# Patient Record
Sex: Female | Born: 1955 | Race: Black or African American | Hispanic: No | Smoking: Never smoker
Health system: Southern US, Community
[De-identification: ages and names within clinical notes are randomized; demographics above are authoritative.]

## PROBLEM LIST (undated history)

## (undated) DIAGNOSIS — R079 Chest pain, unspecified: Secondary | ICD-10-CM

## (undated) DIAGNOSIS — R011 Cardiac murmur, unspecified: Secondary | ICD-10-CM

## (undated) DIAGNOSIS — C50919 Malignant neoplasm of unspecified site of unspecified female breast: Secondary | ICD-10-CM

## (undated) DIAGNOSIS — H269 Unspecified cataract: Secondary | ICD-10-CM

## (undated) DIAGNOSIS — R7303 Prediabetes: Secondary | ICD-10-CM

## (undated) DIAGNOSIS — E785 Hyperlipidemia, unspecified: Secondary | ICD-10-CM

## (undated) DIAGNOSIS — I2699 Other pulmonary embolism without acute cor pulmonale: Secondary | ICD-10-CM

## (undated) DIAGNOSIS — J45909 Unspecified asthma, uncomplicated: Secondary | ICD-10-CM

## (undated) DIAGNOSIS — Z90710 Acquired absence of both cervix and uterus: Secondary | ICD-10-CM

## (undated) DIAGNOSIS — K219 Gastro-esophageal reflux disease without esophagitis: Secondary | ICD-10-CM

## (undated) DIAGNOSIS — E079 Disorder of thyroid, unspecified: Secondary | ICD-10-CM

## (undated) DIAGNOSIS — I10 Essential (primary) hypertension: Secondary | ICD-10-CM

## (undated) DIAGNOSIS — D649 Anemia, unspecified: Secondary | ICD-10-CM

## (undated) DIAGNOSIS — T7840XA Allergy, unspecified, initial encounter: Secondary | ICD-10-CM

## (undated) HISTORY — DX: Unspecified cataract: H26.9

## (undated) HISTORY — DX: Anemia, unspecified: D64.9

## (undated) HISTORY — DX: Chest pain, unspecified: R07.9

## (undated) HISTORY — PX: ABDOMINAL HYSTERECTOMY: SHX81

## (undated) HISTORY — DX: Unspecified asthma, uncomplicated: J45.909

## (undated) HISTORY — DX: Other pulmonary embolism without acute cor pulmonale: I26.99

## (undated) HISTORY — DX: Allergy, unspecified, initial encounter: T78.40XA

## (undated) HISTORY — DX: Malignant neoplasm of unspecified site of unspecified female breast: C50.919

## (undated) HISTORY — DX: Cardiac murmur, unspecified: R01.1

## (undated) HISTORY — DX: Hyperlipidemia, unspecified: E78.5

## (undated) HISTORY — DX: Gastro-esophageal reflux disease without esophagitis: K21.9

## (undated) HISTORY — DX: Acquired absence of both cervix and uterus: Z90.710

---

## 1997-08-06 ENCOUNTER — Encounter: Admission: RE | Admit: 1997-08-06 | Discharge: 1997-08-06 | Payer: Self-pay | Admitting: *Deleted

## 1998-03-19 ENCOUNTER — Encounter: Payer: Self-pay | Admitting: Family Medicine

## 1998-03-19 ENCOUNTER — Ambulatory Visit (HOSPITAL_COMMUNITY): Admission: RE | Admit: 1998-03-19 | Discharge: 1998-03-19 | Payer: Self-pay | Admitting: Family Medicine

## 1998-04-07 ENCOUNTER — Ambulatory Visit (HOSPITAL_COMMUNITY): Admission: RE | Admit: 1998-04-07 | Discharge: 1998-04-07 | Payer: Self-pay | Admitting: Family Medicine

## 1998-04-07 ENCOUNTER — Encounter: Payer: Self-pay | Admitting: Family Medicine

## 1998-08-06 ENCOUNTER — Other Ambulatory Visit: Admission: RE | Admit: 1998-08-06 | Discharge: 1998-08-06 | Payer: Self-pay | Admitting: *Deleted

## 1998-08-20 ENCOUNTER — Encounter: Payer: Self-pay | Admitting: *Deleted

## 1998-08-20 ENCOUNTER — Ambulatory Visit (HOSPITAL_COMMUNITY): Admission: RE | Admit: 1998-08-20 | Discharge: 1998-08-20 | Payer: Self-pay | Admitting: *Deleted

## 1999-08-21 ENCOUNTER — Ambulatory Visit (HOSPITAL_COMMUNITY): Admission: RE | Admit: 1999-08-21 | Discharge: 1999-08-21 | Payer: Self-pay | Admitting: Obstetrics & Gynecology

## 1999-08-21 ENCOUNTER — Encounter: Payer: Self-pay | Admitting: Obstetrics & Gynecology

## 2000-10-20 ENCOUNTER — Other Ambulatory Visit: Admission: RE | Admit: 2000-10-20 | Discharge: 2000-10-20 | Payer: Self-pay | Admitting: Obstetrics and Gynecology

## 2000-11-08 ENCOUNTER — Ambulatory Visit (HOSPITAL_COMMUNITY): Admission: RE | Admit: 2000-11-08 | Discharge: 2000-11-08 | Payer: Self-pay | Admitting: Obstetrics and Gynecology

## 2000-11-08 ENCOUNTER — Encounter: Payer: Self-pay | Admitting: Obstetrics and Gynecology

## 2001-01-02 ENCOUNTER — Ambulatory Visit (HOSPITAL_COMMUNITY): Admission: RE | Admit: 2001-01-02 | Discharge: 2001-01-02 | Payer: Self-pay | Admitting: Obstetrics and Gynecology

## 2001-01-02 ENCOUNTER — Encounter: Payer: Self-pay | Admitting: Obstetrics and Gynecology

## 2001-01-26 ENCOUNTER — Encounter: Payer: Self-pay | Admitting: Obstetrics and Gynecology

## 2001-01-26 ENCOUNTER — Ambulatory Visit (HOSPITAL_COMMUNITY): Admission: RE | Admit: 2001-01-26 | Discharge: 2001-01-26 | Payer: Self-pay | Admitting: Obstetrics and Gynecology

## 2001-03-10 ENCOUNTER — Encounter: Admission: RE | Admit: 2001-03-10 | Discharge: 2001-03-10 | Payer: Self-pay | Admitting: Gynecology

## 2001-03-10 ENCOUNTER — Encounter: Payer: Self-pay | Admitting: Gynecology

## 2001-11-08 ENCOUNTER — Ambulatory Visit (HOSPITAL_COMMUNITY): Admission: RE | Admit: 2001-11-08 | Discharge: 2001-11-08 | Payer: Self-pay | Admitting: Gynecology

## 2001-11-08 ENCOUNTER — Encounter: Payer: Self-pay | Admitting: Gynecology

## 2001-11-21 ENCOUNTER — Encounter: Payer: Self-pay | Admitting: Gynecology

## 2001-11-21 ENCOUNTER — Ambulatory Visit (HOSPITAL_COMMUNITY): Admission: RE | Admit: 2001-11-21 | Discharge: 2001-11-21 | Payer: Self-pay | Admitting: Gynecology

## 2001-12-18 ENCOUNTER — Encounter: Payer: Self-pay | Admitting: Gynecology

## 2001-12-18 ENCOUNTER — Ambulatory Visit (HOSPITAL_COMMUNITY): Admission: RE | Admit: 2001-12-18 | Discharge: 2001-12-18 | Payer: Self-pay | Admitting: Gynecology

## 2002-01-09 ENCOUNTER — Ambulatory Visit (HOSPITAL_COMMUNITY): Admission: RE | Admit: 2002-01-09 | Discharge: 2002-01-09 | Payer: Self-pay | Admitting: Gynecology

## 2002-01-09 ENCOUNTER — Encounter: Payer: Self-pay | Admitting: Gynecology

## 2004-01-09 ENCOUNTER — Ambulatory Visit (HOSPITAL_COMMUNITY): Admission: RE | Admit: 2004-01-09 | Discharge: 2004-01-09 | Payer: Self-pay | Admitting: Internal Medicine

## 2006-08-24 ENCOUNTER — Encounter: Admission: RE | Admit: 2006-08-24 | Discharge: 2006-08-24 | Payer: Self-pay | Admitting: Internal Medicine

## 2006-12-05 ENCOUNTER — Ambulatory Visit (HOSPITAL_COMMUNITY): Admission: RE | Admit: 2006-12-05 | Discharge: 2006-12-05 | Payer: Self-pay | Admitting: Family Medicine

## 2007-04-30 ENCOUNTER — Observation Stay (HOSPITAL_COMMUNITY): Admission: EM | Admit: 2007-04-30 | Discharge: 2007-05-01 | Payer: Self-pay | Admitting: Emergency Medicine

## 2007-05-01 ENCOUNTER — Encounter (INDEPENDENT_AMBULATORY_CARE_PROVIDER_SITE_OTHER): Payer: Self-pay | Admitting: Internal Medicine

## 2010-02-11 ENCOUNTER — Encounter
Admission: RE | Admit: 2010-02-11 | Discharge: 2010-02-11 | Payer: Self-pay | Source: Home / Self Care | Attending: Internal Medicine | Admitting: Internal Medicine

## 2010-02-28 ENCOUNTER — Encounter: Payer: Self-pay | Admitting: Internal Medicine

## 2010-06-23 NOTE — H&P (Signed)
NAMESTACEYANN, Hester               ACCOUNT NO.:  [id number]   MEDICAL RECORD NO.:  [id number]          PATIENT TYPE:  OBV   LOCATION:  0106                         FACILITY:  [facility]   PHYSICIAN:  Ladell Pier, M.D.   DATE OF BIRTH:  02-15-1955   DATE OF ADMISSION:  04/30/2007  DATE OF DISCHARGE:                              HISTORY & PHYSICAL   CHIEF COMPLAINT:  Chest pain.   HISTORY OF PRESENT ILLNESS:  The patient is a 55 year old African  American female that presents today with chest pain.  She states that it  hurts in the center of her chest radiating to her back.  She only gets  the pain when she walks or when she is driving and she goes over a bump.  She does not get the pain at rest.  The pain normally goes away when she  stops whatever activity she is doing.  She has no shortness of breath,  no diaphoresis, no nausea or vomiting, no pain when she presses on the  area.  She had this about 3 years ago but at the time she was coughing  and she was told it was probably bronchitis with her history asthma.   PAST MEDICAL HISTORY:  Significant for hypothyroidism and asthma.   FAMILY HISTORY:  Mother is 38 with diabetes, hypertension.  Father is 64  with hypertension.   SOCIAL HISTORY:  She does not smoke or drink alcohol.  She is married.  She has no children.  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   MEDICATION:  Synthroid 50 mcg daily.   ALLERGIES:  None.   REVIEW OF SYSTEMS:  Negative or otherwise stated in the HPI.   PHYSICAL EXAMINATION:  Temperature 98.4, blood pressure 121/82, pulse of  89, respirations 18, pulse oximetry 99% on room air.  HEENT:  Normocephalic, atraumatic.  Pupils reactive to light.  Throat  without erythema.  CARDIOVASCULAR:  Regular rate and rhythm.  LUNGS:  Clear bilaterally with a 2/6 systolic murmur.  ABDOMEN:  Positive bowel sounds.  EXTREMITIES:  Without edema.   LABORATORIES:  EKG shows T-wave  inversion in the lateral leads.  WBC  4.7, hemoglobin 11.3, MCV 77.5, platelet of 300.  Myoglobin 41, MB less  than 1.0, troponin less than 0.05.  Sodium 140, potassium 4.6, chloride  108, CO2 30, glucose 80, BUN 6, creatinine 0.84, calcium 8.5.   ASSESSMENT AND PLAN:  1. Chest pain.  2. Hypothyroidism.  3. Mild anemia.  4. Heart murmur.  5. T-wave inversion on EKG.   Will admit the patient to the hospital, rule out with serial cardiac  enzymes.  Will check fasting lipid panel, TSH and D-dimer.  If all is  negative, the patient could be discharged home to follow up with PCP for  further evaluation.  Her chest x-ray report is pending.      Ladell Pier, M.D.  Electronically Signed     NJ/MEDQ  D:  04/30/2007  T:  04/30/2007  Job:  [id number]

## 2010-06-23 NOTE — Discharge Summary (Signed)
[name], [name]               ACCOUNT NO.:  [id number]   MEDICAL RECORD NO.:  [id number]          PATIENT TYPE:  OBV   LOCATION:  1434                         FACILITY:  [facility]   PHYSICIAN:  Ladell Pier, M.D.   DATE OF BIRTH:  1955-08-16   DATE OF ADMISSION:  04/30/2007  DATE OF DISCHARGE:  05/01/2007                               DISCHARGE SUMMARY   DISCHARGE DIAGNOSIS:  1. Chest pain with negative cardiac enzymes, nonspecific T-wave      changes on EKG, normal D-dimer, 2-D echo pending.  The patient is      going to follow up this week with her primary care physician for      results and to schedule outpatient stress test.  2. Hypothyroidism.  3. Heart murmur.   DISCHARGE MEDICATIONS:  Synthroid 50 mcg daily.   FOLLOWUP APPOINTMENTS:  The patient will follow up this week with PCP.   PROCEDURES:  None.   CONSULTANTS:  None.   HISTORY OF PRESENT ILLNESS:  The patient is a 55 year old African  American female that presented today with chest pain.  She stated it  hurts in the center of her chest radiating to her back. She only gets  the pain when she walks, while she is sitting down driving. She normally  does not get it at rest.  She has no diaphoresis.  No nausea, no  vomiting, no shortness of breath with the pain.  The last time she had  this 3 years ago she was told was probably early bronchitis.  She is  coughing, but not much.   PAST MEDICAL HISTORY FAMILY HISTORY SOCIAL HISTORY MEDS ALLERGIES REVIEW  OF SYSTEMS:  Per admission H&P.   PHYSICAL EXAMINATION ON DISCHARGE:  Temperature 98.2, pulse 95,  respirations 18, blood pressure 117/72, pulse ox 99% on room air.  HEENT: Head is normocephalic, atraumatic.  Pupils reactive to light  without erythema.  CARDIOVASCULAR:  Regular rate and rhythm with a 1/6 systolic murmur.  LUNGS: Clear bilaterally.  No wheezes, rhonchi or rales.  ABDOMEN:  Positive bowel sounds.  EXTREMITIES: Without edema.   HOSPITAL COURSE:  1. Chest pain:  The patient was admitted to the hospital to rule out      myocardial infarction.  Her cardiac enzymes were normal, D-dimer      normal.  Chest X-Ray: No acute or active disease.  She has some      diffuse more in lateral leads  T-wave inversions that was also      present on repeat EKG today.  She is chest pain free. She will      follow up with her primary care physician for possible outpatient      stress test.  She did have a 2-D echo done inpatient but  that is      still pending.  Discussed this with the patient.  2. Hypothyroidism:  TSH looks good. Continue her on the same dose.   DISCHARGE LABS:  Total cholesterol 163, triglycerides 117, HDL 57, LDL  83, CK 84, MB 0.4,  troponin 0.01.  WBC 4.8, hemoglobin 10.6,  platelet  270, D-dimer is 0.32, TSH of 1.721.  Chest X-Ray: No acute disease.  EKG  some lateral T-wave inversions.      Ladell Pier, M.D.  Electronically Signed     NJ/MEDQ  D:  05/01/2007  T:  05/01/2007  Job:  [id number]   cc:   Tanya D. Daphine Deutscher, M.D.  Fax: [phone]

## 2010-11-02 LAB — DIFFERENTIAL
Basophils Absolute: 0
Basophils Relative: 1
Eosinophils Absolute: 0.1
Eosinophils Relative: 2
Lymphocytes Relative: 50 — ABNORMAL HIGH
Neutro Abs: 1.8

## 2010-11-02 LAB — CK TOTAL AND CKMB (NOT AT ARMC)
CK, MB: 0.4
CK, MB: 0.5
Relative Index: INVALID
Relative Index: INVALID
Total CK: 111

## 2010-11-02 LAB — BASIC METABOLIC PANEL
BUN: 5 — ABNORMAL LOW
BUN: 6
Calcium: 8.5
Chloride: 108
Creatinine, Ser: 0.77
GFR calc Af Amer: >60
GFR calc Af Amer: >60
GFR calc non Af Amer: >60
Glucose, Bld: 80
Glucose, Bld: 99
Sodium: 140
Sodium: 141

## 2010-11-02 LAB — POCT CARDIAC MARKERS
CKMB, poc: <1 — ABNORMAL LOW
Myoglobin, poc: 41

## 2010-11-02 LAB — CBC
Hemoglobin: 10.6 — ABNORMAL LOW
MCHC: 33.4
Platelets: 300
RBC: 4.04
RDW: 14.2

## 2010-11-02 LAB — D-DIMER, QUANTITATIVE: D-Dimer, Quant: 0.32

## 2010-11-02 LAB — TSH: TSH: 1.721

## 2010-11-02 LAB — LIPID PANEL: Cholesterol: 163

## 2010-11-02 LAB — TROPONIN I
Troponin I: 0.01
Troponin I: 0.01

## 2011-03-03 ENCOUNTER — Other Ambulatory Visit: Payer: Self-pay | Admitting: Obstetrics and Gynecology

## 2011-03-03 ENCOUNTER — Other Ambulatory Visit (HOSPITAL_COMMUNITY)
Admission: RE | Admit: 2011-03-03 | Discharge: 2011-03-03 | Disposition: A | Discharge status: Home / Self Care | Payer: Self-pay | Source: Ambulatory Visit | Attending: Obstetrics and Gynecology | Admitting: Obstetrics and Gynecology

## 2011-03-03 DIAGNOSIS — Z01419 Encounter for gynecological examination (general) (routine) without abnormal findings: Secondary | ICD-10-CM | POA: Insufficient documentation

## 2011-05-09 ENCOUNTER — Other Ambulatory Visit: Payer: Self-pay

## 2011-05-09 ENCOUNTER — Encounter (HOSPITAL_BASED_OUTPATIENT_CLINIC_OR_DEPARTMENT_OTHER): Payer: Self-pay | Admitting: *Deleted

## 2011-05-09 ENCOUNTER — Emergency Department (INDEPENDENT_AMBULATORY_CARE_PROVIDER_SITE_OTHER): Payer: Self-pay

## 2011-05-09 ENCOUNTER — Inpatient Hospital Stay (HOSPITAL_BASED_OUTPATIENT_CLINIC_OR_DEPARTMENT_OTHER)
Admission: EM | Admit: 2011-05-09 | Discharge: 2011-05-13 | DRG: 175 | Disposition: A | Discharge status: Home / Self Care | Payer: Self-pay | Attending: Internal Medicine | Admitting: Internal Medicine

## 2011-05-09 DIAGNOSIS — J90 Pleural effusion, not elsewhere classified: Secondary | ICD-10-CM

## 2011-05-09 DIAGNOSIS — E039 Hypothyroidism, unspecified: Secondary | ICD-10-CM | POA: Diagnosis present

## 2011-05-09 DIAGNOSIS — E079 Disorder of thyroid, unspecified: Secondary | ICD-10-CM | POA: Diagnosis present

## 2011-05-09 DIAGNOSIS — M549 Dorsalgia, unspecified: Secondary | ICD-10-CM

## 2011-05-09 DIAGNOSIS — Z87892 Personal history of anaphylaxis: Secondary | ICD-10-CM

## 2011-05-09 DIAGNOSIS — I2699 Other pulmonary embolism without acute cor pulmonale: Principal | ICD-10-CM

## 2011-05-09 DIAGNOSIS — Z7901 Long term (current) use of anticoagulants: Secondary | ICD-10-CM

## 2011-05-09 DIAGNOSIS — R079 Chest pain, unspecified: Secondary | ICD-10-CM

## 2011-05-09 DIAGNOSIS — Z90710 Acquired absence of both cervix and uterus: Secondary | ICD-10-CM

## 2011-05-09 DIAGNOSIS — Z91013 Allergy to seafood: Secondary | ICD-10-CM

## 2011-05-09 DIAGNOSIS — Z79899 Other long term (current) drug therapy: Secondary | ICD-10-CM

## 2011-05-09 DIAGNOSIS — I059 Rheumatic mitral valve disease, unspecified: Secondary | ICD-10-CM | POA: Diagnosis present

## 2011-05-09 DIAGNOSIS — R918 Other nonspecific abnormal finding of lung field: Secondary | ICD-10-CM

## 2011-05-09 DIAGNOSIS — J189 Pneumonia, unspecified organism: Secondary | ICD-10-CM | POA: Diagnosis present

## 2011-05-09 HISTORY — DX: Disorder of thyroid, unspecified: E07.9

## 2011-05-09 LAB — CBC
Hemoglobin: 11.8 g/dL — ABNORMAL LOW (ref 12.0–15.0)
MCHC: 35 g/dL (ref 30.0–36.0)
RBC: 4.61 MIL/uL (ref 3.87–5.11)

## 2011-05-09 LAB — COMPREHENSIVE METABOLIC PANEL
ALT: 23 U/L (ref 0–35)
AST: 30 U/L (ref 0–37)
Alkaline Phosphatase: 74 U/L (ref 39–117)
GFR calc Af Amer: >90 mL/min (ref >90)
Glucose, Bld: 115 mg/dL — ABNORMAL HIGH (ref 70–99)
Potassium: 3.9 mEq/L (ref 3.5–5.1)
Sodium: 137 mEq/L (ref 135–145)
Total Protein: 7.8 g/dL (ref 6.0–8.3)

## 2011-05-09 MED ORDER — AZITHROMYCIN 250 MG PO TABS
500.0000 mg | ORAL_TABLET | Freq: Once | ORAL | Status: AC
  Administered 2011-05-09: 500 mg via ORAL
  Filled 2011-05-09: qty 2

## 2011-05-09 MED ORDER — WARFARIN - PHARMACIST DOSING INPATIENT: Freq: Every day | Status: DC

## 2011-05-09 MED ORDER — SODIUM CHLORIDE 0.9 % IV SOLN: 20.0000 mL | INTRAVENOUS | Status: DC

## 2011-05-09 MED ORDER — SODIUM CHLORIDE 0.9 % IJ SOLN
3.0000 mL | Freq: Two times a day (BID) | INTRAMUSCULAR | Status: DC
  Administered 2011-05-10 – 2011-05-13 (×7): 3 mL via INTRAVENOUS

## 2011-05-09 MED ORDER — IOHEXOL 350 MG/ML SOLN
100.0000 mL | Freq: Once | INTRAVENOUS | Status: AC | PRN
  Administered 2011-05-09: 100 mL via INTRAVENOUS

## 2011-05-09 MED ORDER — MORPHINE SULFATE 2 MG/ML IJ SOLN
2.0000 mg | INTRAMUSCULAR | Status: DC | PRN
  Administered 2011-05-10 – 2011-05-13 (×2): 2 mg via INTRAVENOUS
  Filled 2011-05-09 (×3): qty 1

## 2011-05-09 MED ORDER — LEVOTHYROXINE SODIUM 75 MCG PO TABS
75.0000 ug | ORAL_TABLET | Freq: Every day | ORAL | Status: DC
  Administered 2011-05-10 – 2011-05-13 (×4): 75 ug via ORAL
  Filled 2011-05-09 (×5): qty 1

## 2011-05-09 MED ORDER — ONDANSETRON HCL 4 MG/2ML IJ SOLN
4.0000 mg | Freq: Four times a day (QID) | INTRAMUSCULAR | Status: DC | PRN
  Administered 2011-05-10: 4 mg via INTRAVENOUS
  Filled 2011-05-09: qty 2

## 2011-05-09 MED ORDER — ONDANSETRON HCL 4 MG PO TABS: 4.0000 mg | ORAL_TABLET | Freq: Four times a day (QID) | ORAL | Status: DC | PRN

## 2011-05-09 MED ORDER — DEXTROSE 5 % IV SOLN
1.0000 g | INTRAVENOUS | Status: DC
  Administered 2011-05-09 – 2011-05-12 (×4): 1 g via INTRAVENOUS
  Filled 2011-05-09 (×5): qty 10

## 2011-05-09 MED ORDER — SODIUM CHLORIDE 0.9 % IJ SOLN: 3.0000 mL | INTRAMUSCULAR | Status: DC | PRN

## 2011-05-09 MED ORDER — SODIUM CHLORIDE 0.9 % IJ SOLN
3.0000 mL | Freq: Two times a day (BID) | INTRAMUSCULAR | Status: DC
  Administered 2011-05-11: 3 mL via INTRAVENOUS

## 2011-05-09 MED ORDER — HEPARIN BOLUS VIA INFUSION
4000.0000 [IU] | Freq: Once | INTRAVENOUS | Status: AC
  Administered 2011-05-09: 4000 [IU] via INTRAVENOUS

## 2011-05-09 MED ORDER — PATIENT'S GUIDE TO USING COUMADIN BOOK
Freq: Once | Status: AC
  Administered 2011-05-10: 1
  Filled 2011-05-09: qty 1

## 2011-05-09 MED ORDER — WARFARIN VIDEO
Freq: Once | Status: AC
  Administered 2011-05-10: 12:00:00

## 2011-05-09 MED ORDER — SENNA 8.6 MG PO TABS
1.0000 | ORAL_TABLET | Freq: Two times a day (BID) | ORAL | Status: DC | PRN
  Administered 2011-05-11 – 2011-05-12 (×2): 8.6 mg via ORAL
  Filled 2011-05-09: qty 1

## 2011-05-09 MED ORDER — SODIUM CHLORIDE 0.9 % IV SOLN: 250.0000 mL | INTRAVENOUS | Status: DC | PRN

## 2011-05-09 MED ORDER — HEPARIN (PORCINE) IN NACL 100-0.45 UNIT/ML-% IJ SOLN
1400.0000 [IU]/h | INTRAMUSCULAR | Status: DC
  Administered 2011-05-09: 1000 [IU]/h via INTRAVENOUS
  Administered 2011-05-10: 1200 [IU]/h via INTRAVENOUS
  Administered 2011-05-10: 1100 [IU]/h via INTRAVENOUS
  Administered 2011-05-11: 1200 [IU]/h via INTRAVENOUS
  Administered 2011-05-12: 1400 [IU]/h via INTRAVENOUS
  Administered 2011-05-12: 1200 [IU]/h via INTRAVENOUS
  Filled 2011-05-09 (×10): qty 250

## 2011-05-09 MED ORDER — WARFARIN SODIUM 7.5 MG PO TABS
7.5000 mg | ORAL_TABLET | Freq: Once | ORAL | Status: AC
  Administered 2011-05-10: 7.5 mg via ORAL
  Filled 2011-05-09: qty 1

## 2011-05-09 NOTE — Progress Notes (Signed)
ANTICOAGULATION CONSULT NOTE - Initial Consult  Pharmacy Consult for Heparin and Coumadin Indication: pulmonary embolus  Allergies  Allergen Reactions  . Shellfish Allergy Anaphylaxis    Patient Measurements: Height: 5' 6.5" (168.9 cm) Weight: 133 lb 2.5 oz (60.4 kg) IBW/kg (Calculated) : 60.45  Heparin Dosing Weight: 60.4 kg  Vital Signs: Temp: 98.3 F (36.8 C) (03/31 2100) Temp src: Tympanic (03/31 2100) BP: 158/92 mmHg (03/31 2100) Pulse Rate: 104  (03/31 2100)  Labs:  Basename 05/09/11 1542  HGB 11.8*  HCT 33.7*  PLT 447*  APTT --  LABPROT 13.8  INR 1.04  HEPARINUNFRC --  CREATININE 0.60  CKTOTAL --  CKMB --  TROPONINI --   Estimated Creatinine Clearance: 77.5 ml/min (by C-G formula based on Cr of 0.6).  Medical History: Past Medical History  Diagnosis Date  . Thyroid disease    Assessment:   New LLE PE, confirmed by CT angiogram.  Chest pain for several days, s/p hysterectomy 10 days ago.   Heparin initiated at [facility] ~ 6:40 pm on 3/31 with 4000 unit bolus and 1000 units/hr infusion.  Reasonable doses for her weight.   Coumadin also to begin tonight.  Overlap day #1 of 5 minimum.   Received Rocephin 1 gram & Azithromycin 500 mg IV at [facility] for pneumonia coverage.  Rocephin continued on transfer to [facility].  Goal of Therapy:  Heparin level 0.3-0.7 units/ml INR 2-3   Plan:    Will continue Heparin drip at 1000 units/hr.   First level ordered for 1am, about 6 hrs after drip begun.   Will begin Coumadin with 7.5 mg PO today.   Coumadin education materials ordered.   Daily heparin level, CBC and PT/INR.  Dennie Fetters, [place] Pager: [phone] 05/09/2011,11:54 PM

## 2011-05-09 NOTE — ED Notes (Signed)
Pt states she had a hyst hon the 21st and has had CP, back and side pain "even before I left the hospital". "Has progressively gotten worse. Denies other s/s.

## 2011-05-09 NOTE — ED Provider Notes (Signed)
History   This chart was scribed for Brenda Kras, MD by Melba Coon. The patient was seen in room MH01/MH01 and the patient's care was started at 3:35PM.    CSN: [id number]  Arrival date & time 05/09/11  1426   First MD Initiated Contact with Patient 05/09/11 1533      Chief Complaint  Patient presents with  . Post-op Problem    (Consider location/radiation/quality/duration/timing/severity/associated sxs/prior treatment) HPI [name] is a 56 y.o. female who presents to the Emergency Department complaining of constant, sharp, moderate to severe chest pain with associated abdominal pian with an onset a week ad a half ago. Pt recently just had a hysterectomy on Mar 21st and had current symptoms since the surgery "even before I left the hospital" which has gotten progressively worse. Pt has not f/u with surgeon or any doctor since the surgery. Deep ventilation aggravates the chest pain. Slight wheezing present (hx of asthma). No HA, neck pain, rash, sore throat, back pain, n/v/d, dysuria, or extremity pain, edema, weakness, numbness, or tingling. No known allergies. No other pertinent medical symptoms.   Past Medical History  Diagnosis Date  . Thyroid disease     Past Surgical History  Procedure Date  . Abdominal hysterectomy     History reviewed. No pertinent family history.  History  Substance Use Topics  . Smoking status: Never Smoker   . Smokeless tobacco: Not on file  . Alcohol Use: No    OB History    Grav Para Term Preterm Abortions TAB SAB Ect Mult Living                  Review of Systems 10 Systems reviewed and all are negative for acute change except as noted in the HPI.   Allergies  Review of patient's allergies indicates no known allergies.  Home Medications   Current Outpatient Rx  Name Route Sig Dispense Refill  . LEVOTHYROXINE SODIUM 75 MCG PO TABS Oral Take 75 mcg by mouth daily.      BP 155/73  Pulse 102  Temp(Src) 98.1 F (36.7  C) (Oral)  Resp 20  Ht 5' 6.5" (1.689 m)  Wt 140 lb (63.504 kg)  BMI 22.26 kg/m2  SpO2 99%  Physical Exam  Nursing note and vitals reviewed. Constitutional: She appears well-developed and well-nourished. No distress.  HENT:  Head: Normocephalic and atraumatic.  Right Ear: External ear normal.  Left Ear: External ear normal.  Eyes: Conjunctivae are normal. Right eye exhibits no discharge. Left eye exhibits no discharge. No scleral icterus.  Neck: Neck supple. No tracheal deviation present.  Cardiovascular: Normal rate, regular rhythm and intact distal pulses.   Pulmonary/Chest: Effort normal and breath sounds normal. No stridor. No respiratory distress. She has no wheezes.       Crackles on rt side  Abdominal: Soft. Bowel sounds are normal. She exhibits no distension. There is no rebound and no guarding.       Vertical midline surgical wound with staples, no erythema or drainage; incisional tenderness as expected  Musculoskeletal: She exhibits no edema and no tenderness.  Neurological: She is alert. She has normal strength. No sensory deficit. Cranial nerve deficit:  no gross defecits noted. She exhibits normal muscle tone. She displays no seizure activity. Coordination normal.  Skin: Skin is warm and dry. No rash noted.  Psychiatric: She has a normal mood and affect.    ED Course  Procedures (including critical care time)  Date: 05/09/2011  Rate:  100  Rhythm: normal sinus rhythm  QRS Axis: normal  Intervals: normal  ST/T Wave abnormalities: nonspecific T wave changes  Conduction Disutrbances:none  Narrative Interpretation: left vent. Hypertrophy   Old EKG Reviewed: unchanged except lvh criteria are new  Medications  levothyroxine (SYNTHROID, LEVOTHROID) 75 MCG tablet (not administered)  0.9 %  sodium chloride infusion (not administered)  heparin bolus via infusion 4,000 Units (not administered)  heparin ADULT infusion 100 units/mL (25000 units/250 mL) (not administered)    cefTRIAXone (ROCEPHIN) 1 g in dextrose 5 % 50 mL IVPB (not administered)  azithromycin (ZITHROMAX) tablet 500 mg (not administered)  iohexol (OMNIPAQUE) 350 MG/ML injection 100 mL (100 mL Intravenous Contrast Given 05/09/11 1752)     DIAGNOSTIC STUDIES: Oxygen Saturation is 99% on room air, normal by my interpretation.    COORDINATION OF CARE:  3:40PM - EDMD will order UA, blood w/u for the pt  Results for orders placed during the hospital encounter of 05/09/11  CBC      Component Value Range   WBC 12.7 (*) 4.0 - 10.5 (K/uL)   RBC 4.61  3.87 - 5.11 (MIL/uL)   Hemoglobin 11.8 (*) 12.0 - 15.0 (g/dL)   HCT 16.1 (*) 09.6 - 46.0 (%)   MCV 73.1 (*) 78.0 - 100.0 (fL)   MCH 25.6 (*) 26.0 - 34.0 (pg)   MCHC 35.0  30.0 - 36.0 (g/dL)   RDW 04.5  40.9 - 81.1 (%)   Platelets 447 (*) 150 - 400 (K/uL)  COMPREHENSIVE METABOLIC PANEL      Component Value Range   Sodium 137  135 - 145 (mEq/L)   Potassium 3.9  3.5 - 5.1 (mEq/L)   Chloride 97  96 - 112 (mEq/L)   CO2 29  19 - 32 (mEq/L)   Glucose, Bld 115 (*) 70 - 99 (mg/dL)   BUN 5 (*) 6 - 23 (mg/dL)   Creatinine, Ser 9.14  0.50 - 1.10 (mg/dL)   Calcium 9.3  8.4 - 78.2 (mg/dL)   Total Protein 7.8  6.0 - 8.3 (g/dL)   Albumin 3.3 (*) 3.5 - 5.2 (g/dL)   AST 30  0 - 37 (U/L)   ALT 23  0 - 35 (U/L)   Alkaline Phosphatase 74  39 - 117 (U/L)   Total Bilirubin 0.4  0.3 - 1.2 (mg/dL)   GFR calc non Af Amer >90  >90 (mL/min)   GFR calc Af Amer >90  >90 (mL/min)  PROTIME-INR      Component Value Range   Prothrombin Time 13.8  11.6 - 15.2 (seconds)   INR 1.04  0.00 - 1.49   D-DIMER, QUANTITATIVE      Component Value Range   D-Dimer, Quant 5.10 (*) 0.00 - 0.48 (ug/mL-FEU)     Dg Chest 2 View  05/09/2011  *RADIOLOGY REPORT*  Clinical Data: Abdominal hysterectomy now with chest pain and back pain  CHEST - 2 VIEW  Comparison: 04/30/2007  Findings:   Heart size appears normal.  There are bilateral pleural effusions right greater than left.   Airspace consolidation within the right lower lobe is identified consistent with pneumonia.  IMPRESSION:  1.  Right lower lobe airspace consolidation compatible with pneumonia. 2.  Bilateral pleural effusions, right greater than left.  Original Report Authenticated By: Rosealee Albee, M.D.   Ct Angio Chest W/cm &/or Wo Cm  05/09/2011  *RADIOLOGY REPORT*  Clinical Data: Chest pain.  Elevated D-dimer.  Recent surgery.  CT ANGIOGRAPHY CHEST  Technique:  Multidetector  CT imaging of the chest using the standard protocol during bolus administration of intravenous contrast. Multiplanar reconstructed images including MIPs were obtained and reviewed to evaluate the vascular anatomy.  Contrast: OMNIPAQUE IOHEXOL 350 MG/ML IV SOLN  Comparison: None.  Findings: No enlarged axillary or supraclavicular lymph nodes.  There is no enlarged mediastinal or hilar adenopathy.  No pericardial effusion.  Bilateral pleural effusions are noted right greater than left.  There are abnormal filling defects within the left lower lobe pulmonary artery.  No filling defect to the right lower lobe pulmonary artery.  Bilateral lower lobe airspace consolidation is identified worse in the right lung.  This may represent the sequela of pulmonary infarct or bacterial pneumonia.  Review of the visualized osseous structures shows a mild scoliosis deformity which is convex to the left.  Limited imaging through the upper abdomen is unremarkable.  IMPRESSION:  1.  Examination is positive for acute pulmonary embolus. 2.  Bilateral lower lobe air consolidation which may represent areas of pulmonary infarct or pneumonia 3. Bilateral pleural effusions.  Critical Value/emergent results were called by telephone at the time of interpretation on 04/29/2011  at 6:06 p.m.  to  Dr. Lynelle Doctor, who verbally acknowledged these results.  Original Report Authenticated By: Rosealee Albee, M.D.        MDM  The patient's CT shows an acute pulmonary embolism.  She does have bilateral lower lobe air consolidation as well which could be pulmonary infarct or pneumonia. This is most likely infarct, however I was treat her with antibiotics as well as this time. Patient has been started on IV heparin. I will have the patient transferred and admitted to the hospital for further treatment. At this time she does continue to remain hemodynamically stable.   I personally performed the services described in this documentation, which was scribed in my presence.  The recorded information has been reviewed and considered.         Brenda Kras, MD 05/09/11 [phone]

## 2011-05-09 NOTE — H&P (Signed)
PCP: None   Chief Complaint: Pleuritic chest pain  HPI: [name] is an 56 y.o. female with history of hypothyroidism, abdominal hysterectomy 10 days ago for fibroid, presents to [facility] complaining of pleuritic chest pain for several days, but worse tonight. She denied any fever, chills, productive cough, or any leg swelling. She has no orthopnea or PND. Evaluation in the emergency room included a chest x-ray which showed bilateral pleural effusions and right lower lobe consolidation. A d-dimer was elevated at 5.1, followup CT pulmonary angiogram confirmed left lower lobe acute pulmonary embolism, with bilateral lower consolidation suggestive of pulmonary infarct, but cannot exclude pneumonia, and bilateral pleural effusion. Her white count is 12,000, hemoglobin of 11.8, platelet count 447,000, and normal renal function tests. She was given full dose heparin, Rocephin and Zithromax, and transferred here for further treatment. She maintained hemodynamic stability. Her heart rate is elevated at 110 bpm. Rewiew of Systems:  The patient denies anorexia, fever, weight loss,, vision loss, decreased hearing, hoarseness, , syncope, , peripheral edema, balance deficits, hemoptysis, abdominal pain, melena, hematochezia, severe indigestion/heartburn, hematuria, incontinence, genital sores, muscle weakness, suspicious skin lesions, transient blindness, difficulty walking, depression, unusual weight change, abnormal bleeding, enlarged lymph nodes, angioedema, and breast masses.    Past Medical History  Diagnosis Date  . Thyroid disease     Past Surgical History  Procedure Date  . Abdominal hysterectomy     Medications:  HOME MEDS: Prior to Admission medications   Medication Sig Start Date End Date Taking? Authorizing Provider  acetaminophen (TYLENOL) 500 MG tablet Take 1,000 mg by mouth every 6 (six) hours as needed. For pain   Yes Historical Provider, MD  levothyroxine  (SYNTHROID, LEVOTHROID) 75 MCG tablet Take 75 mcg by mouth daily.   Yes Historical Provider, MD  senna (SENOKOT) 8.6 MG tablet Take 1 tablet by mouth 2 (two) times daily as needed. For constipation   Yes Historical Provider, MD     Allergies:  Allergies  Allergen Reactions  . Shellfish Allergy Anaphylaxis    Social History:   reports that she has never smoked. She does not have any smokeless tobacco history on file. She reports that she does not drink alcohol or use illicit drugs.  Family History: History reviewed. No pertinent family history.   Physical Exam: Filed Vitals:   05/09/11 1445 05/09/11 1840 05/09/11 1936 05/09/11 2100  BP: 155/73 168/88 162/85 158/92  Pulse: 102 107 111 104  Temp: 98.1 F (36.7 C)  98.7 F (37.1 C) 98.3 F (36.8 C)  TempSrc: Oral  Oral Tympanic  Resp: 20 23 22 20   Height: 5' 6.5" (1.689 m)     Weight: 63.504 kg (140 lb)   60.4 kg (133 lb 2.5 oz)  SpO2: 99% 100% 100% 97%   Blood pressure 158/92, pulse 104, temperature 98.3 F (36.8 C), temperature source Tympanic, resp. rate 20, height 5' 6.5" (1.689 m), weight 60.4 kg (133 lb 2.5 oz), SpO2 97.00%.  GEN:  Pleasant  person lying in the stretcher in no acute distress; cooperative with exam PSYCH:  alert and oriented x4; does not appear anxious or depressed; affect is appropriate. HEENT: Mucous membranes pink and anicteric; PERRLA; EOM intact; no cervical lymphadenopathy nor thyromegaly or carotid bruit; no JVD; Breasts:: Not examined CHEST WALL: No tenderness CHEST: Normal respiration, clear to auscultation bilaterally. Decreased at both bases HEART: Regular rate and rhythm; no murmurs rubs or gallops BACK: No kyphosis or scoliosis; no CVA tenderness ABDOMEN: Obese, soft  non-tender; no masses, no organomegaly, normal abdominal bowel sounds; no pannus; no intertriginous candida. No bleeding. Staples clean and dry status post abdominal hysterectomy Rectal Exam: Not done EXTREMITIES: No bone or  joint deformity; age-appropriate arthropathy of the hands and knees; no edema; no ulcerations. Genitalia: not examined PULSES: 2+ and symmetric SKIN: Normal hydration no rash or ulceration CNS: Cranial nerves 2-12 grossly intact no focal lateralizing neurologic deficit   Labs & Imaging Results for orders placed during the hospital encounter of 05/09/11 (from the past 48 hour(s))  CBC     Status: Abnormal   Collection Time   05/09/11  3:42 PM      Component Value Range Comment   WBC 12.7 (*) 4.0 - 10.5 (K/uL)    RBC 4.61  3.87 - 5.11 (MIL/uL)    Hemoglobin 11.8 (*) 12.0 - 15.0 (g/dL)    HCT 45.4 (*) 09.8 - 46.0 (%)    MCV 73.1 (*) 78.0 - 100.0 (fL)    MCH 25.6 (*) 26.0 - 34.0 (pg)    MCHC 35.0  30.0 - 36.0 (g/dL)    RDW 11.9  14.7 - 82.9 (%)    Platelets 447 (*) 150 - 400 (K/uL)   COMPREHENSIVE METABOLIC PANEL     Status: Abnormal   Collection Time   05/09/11  3:42 PM      Component Value Range Comment   Sodium 137  135 - 145 (mEq/L)    Potassium 3.9  3.5 - 5.1 (mEq/L)    Chloride 97  96 - 112 (mEq/L)    CO2 29  19 - 32 (mEq/L)    Glucose, Bld 115 (*) 70 - 99 (mg/dL)    BUN 5 (*) 6 - 23 (mg/dL)    Creatinine, Ser 5.62  0.50 - 1.10 (mg/dL)    Calcium 9.3  8.4 - 10.5 (mg/dL)    Total Protein 7.8  6.0 - 8.3 (g/dL)    Albumin 3.3 (*) 3.5 - 5.2 (g/dL)    AST 30  0 - 37 (U/L)    ALT 23  0 - 35 (U/L)    Alkaline Phosphatase 74  39 - 117 (U/L)    Total Bilirubin 0.4  0.3 - 1.2 (mg/dL)    GFR calc non Af Amer >90  >90 (mL/min)    GFR calc Af Amer >90  >90 (mL/min)   PROTIME-INR     Status: Normal   Collection Time   05/09/11  3:42 PM      Component Value Range Comment   Prothrombin Time 13.8  11.6 - 15.2 (seconds)    INR 1.04  0.00 - 1.49    D-DIMER, QUANTITATIVE     Status: Abnormal   Collection Time   05/09/11  3:42 PM      Component Value Range Comment   D-Dimer, Quant 5.10 (*) 0.00 - 0.48 (ug/mL-FEU)    Dg Chest 2 View  05/09/2011  *RADIOLOGY REPORT*  Clinical Data:  Abdominal hysterectomy now with chest pain and back pain  CHEST - 2 VIEW  Comparison: 04/30/2007  Findings:   Heart size appears normal.  There are bilateral pleural effusions right greater than left.  Airspace consolidation within the right lower lobe is identified consistent with pneumonia.  IMPRESSION:  1.  Right lower lobe airspace consolidation compatible with pneumonia. 2.  Bilateral pleural effusions, right greater than left.  Original Report Authenticated By: Rosealee Albee, M.D.   Ct Angio Chest W/cm &/or Wo Cm  05/09/2011  *RADIOLOGY REPORT*  Clinical Data: Chest pain.  Elevated D-dimer.  Recent surgery.  CT ANGIOGRAPHY CHEST  Technique:  Multidetector CT imaging of the chest using the standard protocol during bolus administration of intravenous contrast. Multiplanar reconstructed images including MIPs were obtained and reviewed to evaluate the vascular anatomy.  Contrast: OMNIPAQUE IOHEXOL 350 MG/ML IV SOLN  Comparison: None.  Findings: No enlarged axillary or supraclavicular lymph nodes.  There is no enlarged mediastinal or hilar adenopathy.  No pericardial effusion.  Bilateral pleural effusions are noted right greater than left.  There are abnormal filling defects within the left lower lobe pulmonary artery.  No filling defect to the right lower lobe pulmonary artery.  Bilateral lower lobe airspace consolidation is identified worse in the right lung.  This may represent the sequela of pulmonary infarct or bacterial pneumonia.  Review of the visualized osseous structures shows a mild scoliosis deformity which is convex to the left.  Limited imaging through the upper abdomen is unremarkable.  IMPRESSION:  1.  Examination is positive for acute pulmonary embolus. 2.  Bilateral lower lobe air consolidation which may represent areas of pulmonary infarct or pneumonia 3. Bilateral pleural effusions.  Critical Value/emergent results were called by telephone at the time of interpretation on 04/29/2011   at 6:06 p.m.  to  Dr. Lynelle Doctor, who verbally acknowledged these results.  Original Report Authenticated By: Rosealee Albee, M.D.      Assessment Present on Admission:  .H/O hysterectomy for benign disease .Thyroid disease Acute Pulmonary Embolism. ######### Pulmonary infarct Possible concomitant pneumonia.  PLAN: We'll continue IV heparin, start Coumadin for provoked pulmonary embolism. Although less likely that she has pneumonia, I will continue her antibiotics at this time. She is stable, full code, and will be admitted under hospitalist service. She'll be given supplemental O2. For her thyroid disease, will continue her Synthroid supplement, and check a TSH. I did discuss pathophysiology pulmonary embolism, along with risks and benefits of anticoagulation. She would likely need at least 6 months of anticoagulation. Other plans as per orders.    Statia Burdick 05/09/2011, 11:06 PM

## 2011-05-10 DIAGNOSIS — I2699 Other pulmonary embolism without acute cor pulmonale: Secondary | ICD-10-CM

## 2011-05-10 LAB — CBC
MCV: 73.8 fL — ABNORMAL LOW (ref 78.0–100.0)
Platelets: 415 10*3/uL — ABNORMAL HIGH (ref 150–400)
RDW: 13.2 % (ref 11.5–15.5)
WBC: 13.1 10*3/uL — ABNORMAL HIGH (ref 4.0–10.5)

## 2011-05-10 LAB — TSH: TSH: 0.38 u[IU]/mL (ref 0.350–4.500)

## 2011-05-10 LAB — PROTIME-INR: INR: 1.18 (ref 0.00–1.49)

## 2011-05-10 LAB — BASIC METABOLIC PANEL
Calcium: 8.4 mg/dL (ref 8.4–10.5)
Creatinine, Ser: 0.48 mg/dL — ABNORMAL LOW (ref 0.50–1.10)
GFR calc Af Amer: >90 mL/min (ref >90)

## 2011-05-10 LAB — HEPARIN LEVEL (UNFRACTIONATED)
Heparin Unfractionated: 0.24 IU/mL — ABNORMAL LOW (ref 0.30–0.70)
Heparin Unfractionated: 0.72 IU/mL — ABNORMAL HIGH (ref 0.30–0.70)

## 2011-05-10 MED ORDER — WARFARIN SODIUM 7.5 MG PO TABS
7.5000 mg | ORAL_TABLET | Freq: Once | ORAL | Status: AC
  Administered 2011-05-10: 7.5 mg via ORAL
  Filled 2011-05-10: qty 1

## 2011-05-10 MED ORDER — HEPARIN BOLUS VIA INFUSION
2500.0000 [IU] | Freq: Once | INTRAVENOUS | Status: AC
  Administered 2011-05-10: 2500 [IU] via INTRAVENOUS
  Filled 2011-05-10: qty 2500

## 2011-05-10 MED ORDER — DEXTROSE 5 % IV SOLN
500.0000 mg | INTRAVENOUS | Status: DC
  Administered 2011-05-10: 500 mg via INTRAVENOUS
  Filled 2011-05-10: qty 500

## 2011-05-10 MED ORDER — AZITHROMYCIN 500 MG PO TABS
500.0000 mg | ORAL_TABLET | Freq: Every day | ORAL | Status: DC
  Administered 2011-05-11 – 2011-05-13 (×3): 500 mg via ORAL
  Filled 2011-05-10 (×3): qty 1

## 2011-05-10 MED ORDER — HYDROCODONE-ACETAMINOPHEN 10-325 MG PO TABS
2.0000 | ORAL_TABLET | Freq: Four times a day (QID) | ORAL | Status: DC | PRN
  Administered 2011-05-10: 2 via ORAL
  Administered 2011-05-11: 1 via ORAL
  Filled 2011-05-10: qty 2
  Filled 2011-05-10: qty 1

## 2011-05-10 NOTE — Progress Notes (Signed)
ANTICOAGULATION CONSULT NOTE - Follow Up Consult  Pharmacy Consult for Heparin/Warfarin Indication: pulmonary embolus  Allergies  Allergen Reactions  . Shellfish Allergy Anaphylaxis    Patient Measurements: Height: 5' 6.5" (168.9 cm) Weight: 133 lb 2.5 oz (60.4 kg) IBW/kg (Calculated) : 60.45  Heparin Dosing Weight: 60 kg  Vital Signs: Temp: 99.8 F (37.7 C) (04/01 0809) Temp src: Oral (04/01 0809) BP: 136/85 mmHg (04/01 0809) Pulse Rate: 107  (04/01 0809)  Labs:  Basename 05/10/11 0837 05/10/11 0048 05/09/11 1542  HGB -- 10.3* 11.8*  HCT -- 29.6* 33.7*  PLT -- 415* 447*  APTT -- -- --  LABPROT -- 15.2 13.8  INR -- 1.18 1.04  HEPARINUNFRC 0.72* 0.24* --  CREATININE -- 0.48* 0.60  CKTOTAL -- -- --  CKMB -- -- --  TROPONINI -- -- --   Estimated Creatinine Clearance: 77.5 ml/min (by C-G formula based on Cr of 0.48).  Assessment: 56 yo F s/p hysterectomy 10 days prior to admission presents with new PE.  She was started on heparin/warfarin. Today is Day#1/5 overlap. No bleeding noted.  Heparin level now slightly >goal after rate increase.    Pt also started on empiric Rocephin/Zithromax for PNA-like sx. Pt is afebrile & tolerating regular diet/oral meds.     Goal of Therapy:  Heparin level 0.3-0.7 units/ml; INR 2-3   Plan:  1) Decrease heparin drip to 1100 units/hr.  Recheck 6hr heparin level. 2) Continue warfarin 7.5mg  po x 1 dose today.  3) F/U daily labs and routine pltc monitoring per protocol 4) Change Zithromax to po per P&T policy. Consider add stop date to Rocephin/Zithromax after 4/4 doses (5 days of tx) 5) Consider change IV heparin to sq Lovenox 90mg  sq daily and discharge patient home on Lovenox/warfarin once medically appropriate.     Elson Clan, PharmD  05/10/2011,10:58 AM

## 2011-05-10 NOTE — Progress Notes (Signed)
   CARE MANAGEMENT NOTE 05/10/2011  Patient:  [name], [name]   Account Number:  [id number]  Date Initiated:  05/10/2011  Documentation initiated by:  Donn Pierini  Subjective/Objective Assessment:   Pt admitted with PE, PNA     Action/Plan:   PTA pt lived at home with spouse, was independent with ADLs   Anticipated DC Date:  05/12/2011   Anticipated DC Plan:  HOME/SELF CARE      DC Planning Services  CM consult  Medication Assistance      Choice offered to / List presented to:             Status of service:  In process, will continue to follow Medicare Important Message given?   (If response is "NO", the following Medicare IM given date fields will be blank) Date Medicare IM given:   Date Additional Medicare IM given:    Discharge Disposition:    Per UR Regulation:    If discussed at Long Length of Stay Meetings, dates discussed:    Comments:  05/10/11- 1515- Donn Pierini RN, BSN [phone] Spoke with pt at bedside- per conversation pt states she lives at home with spouse- who travels frequently. Pt reports that she will have transportation home. Pt reports that she does not have a current PCP- but that she has gone to the [facility] on [address] in past- will call to see if they are taking new pts- and see if they will be able to f/u with pt on PT/INR for lovenox/coumadin. Pt also states that she goes to Dr. Margaretmary Bayley for her thyriod. Call made to Dr. Ophelia Charter office- spoke with Dr. Chestine Spore regarding possibility of following pt's PT/INR at discharge- he stated that he wouldn't mind if pt did not have any other doctor to follow it. Will check into [facility] first. Pt is agreeable to f/u with either clinic and reports that she can handle the fee of $45 for the [facility]. Pt states that she uses [facility] for her medications. CM to follow for d/c needs and planning

## 2011-05-10 NOTE — Progress Notes (Signed)
ANTICOAGULATION CONSULT NOTE - Follow Up Consult  Pharmacy Consult for Heparin Indication: pulmonary embolus  Allergies  Allergen Reactions  . Shellfish Allergy Anaphylaxis    Patient Measurements: Height: 5' 6.5" (168.9 cm) Weight: 133 lb 2.5 oz (60.4 kg) IBW/kg (Calculated) : 60.45  Heparin Dosing Weight: 60 kg  Vital Signs: Temp: 98.3 F (36.8 C) (03/31 2100) Temp src: Tympanic (03/31 2100) BP: 158/92 mmHg (03/31 2100) Pulse Rate: 104  (03/31 2100)  Labs:  Basename 05/10/11 0048 05/09/11 1542  HGB 10.3* 11.8*  HCT 29.6* 33.7*  PLT 415* 447*  APTT -- --  LABPROT 15.2 13.8  INR 1.18 1.04  HEPARINUNFRC 0.24* --  CREATININE 0.48* 0.60  CKTOTAL -- --  CKMB -- --  TROPONINI -- --   Estimated Creatinine Clearance: 77.5 ml/min (by C-G formula based on Cr of 0.48).  Assessment:   Initial heparin level is subtherapeutic on 1000 units/hr.    Goal of Therapy:  Heparin level 0.3-0.7 units/ml   Plan:     Will give Heparin 2500 units IV bolus, then increase drip to 1200 units/hr.   Next level in 6 hrs.  Dennie Fetters, RPh Pager: [phone] 05/10/2011,2:41 AM

## 2011-05-10 NOTE — Progress Notes (Signed)
ANTICOAGULATION CONSULT NOTE: Heparin for PE  Heparin level = 0.43 on 1100 units/hr Goal heparin level = 0.3-0.7   Heparin level is in desired therapeutic range. No change in rate needed. Will resume daily AM heparin level and CBC and F/U in AM.  Cardell Peach, PharmD

## 2011-05-10 NOTE — Progress Notes (Signed)
VASCULAR LAB PRELIMINARY  PRELIMINARY  PRELIMINARY  PRELIMINARY  Bilateral lower extremity venous duplex  completed.    Preliminary report:  Bilateral:  No evidence of DVT, superficial thrombosis, or Baker's Cyst.    Terance Hart, RVT 05/10/2011, 11:30 AM

## 2011-05-10 NOTE — Progress Notes (Signed)
Subjective: Patient seen and examined this morning. Still feels short of breath but better. Denies chest pain.  Objective:  Vital signs in last 24 hours:  Filed Vitals:   05/09/11 1936 05/09/11 2100 05/10/11 0421 05/10/11 0809  BP: 162/85 158/92 155/75 136/85  Pulse: 111 104 112 107  Temp: 98.7 F (37.1 C) 98.3 F (36.8 C) 99.7 F (37.6 C) 99.8 F (37.7 C)  TempSrc: Oral Tympanic Oral Oral  Resp: 22 20 19 20   Height:      Weight:  60.4 kg (133 lb 2.5 oz)    SpO2: 100% 97% 98% 97%    Intake/Output from previous day:   Intake/Output Summary (Last 24 hours) at 05/10/11 1323 Last data filed at 05/10/11 1107  Gross per 24 hour  Intake 448.37 ml  Output      0 ml  Net 448.37 ml    Physical Exam:  General: middle aged female  in no acute distress. HEENT: no pallor, no icterus, moist oral mucosa, no JVD, no lymphadenopathy Heart: Normal  s1 &s2 tachycardic, without murmurs, rubs, gallops. Lungs: Clear to auscultation bilaterally. Abdomen: staples from  recent laprotomy intact, Soft, nontender, nondistended, positive bowel sounds.  Extremities: No clubbing cyanosis or edema with positive pedal pulses.No leg swellings Neuro: Alert, awake, oriented x3, nonfocal.   Lab Results:  Basic Metabolic Panel:    Component Value Date/Time   NA 135 05/10/2011 0048   K 3.3* 05/10/2011 0048   CL 100 05/10/2011 0048   CO2 25 05/10/2011 0048   BUN 4* 05/10/2011 0048   CREATININE 0.48* 05/10/2011 0048   GLUCOSE 147* 05/10/2011 0048   CALCIUM 8.4 05/10/2011 0048   CBC:    Component Value Date/Time   WBC 13.1* 05/10/2011 0048   HGB 10.3* 05/10/2011 0048   HCT 29.6* 05/10/2011 0048   PLT 415* 05/10/2011 0048   MCV 73.8* 05/10/2011 0048   NEUTROABS 1.8 04/30/2007 1125   LYMPHSABS 2.3 04/30/2007 1125   MONOABS 0.4 04/30/2007 1125   EOSABS 0.1 04/30/2007 1125   BASOSABS 0.0 04/30/2007 1125    No results found for this or any previous visit (from the past 240 hour(s)).  Studies/Results: Dg Chest 2  View  05/09/2011  *RADIOLOGY REPORT*  Clinical Data: Abdominal hysterectomy now with chest pain and back pain  CHEST - 2 VIEW  Comparison: 04/30/2007  Findings:   Heart size appears normal.  There are bilateral pleural effusions right greater than left.  Airspace consolidation within the right lower lobe is identified consistent with pneumonia.  IMPRESSION:  1.  Right lower lobe airspace consolidation compatible with pneumonia. 2.  Bilateral pleural effusions, right greater than left.  Original Report Authenticated By: Rosealee Albee, M.D.   Ct Angio Chest W/cm &/or Wo Cm  05/09/2011  *RADIOLOGY REPORT*  Clinical Data: Chest pain.  Elevated D-dimer.  Recent surgery.  CT ANGIOGRAPHY CHEST  Technique:  Multidetector CT imaging of the chest using the standard protocol during bolus administration of intravenous contrast. Multiplanar reconstructed images including MIPs were obtained and reviewed to evaluate the vascular anatomy.  Contrast: OMNIPAQUE IOHEXOL 350 MG/ML IV SOLN  Comparison: None.  Findings: No enlarged axillary or supraclavicular lymph nodes.  There is no enlarged mediastinal or hilar adenopathy.  No pericardial effusion.  Bilateral pleural effusions are noted right greater than left.  There are abnormal filling defects within the left lower lobe pulmonary artery.  No filling defect to the right lower lobe pulmonary artery.  Bilateral lower lobe airspace  consolidation is identified worse in the right lung.  This may represent the sequela of pulmonary infarct or bacterial pneumonia.  Review of the visualized osseous structures shows a mild scoliosis deformity which is convex to the left.  Limited imaging through the upper abdomen is unremarkable.  IMPRESSION:  1.  Examination is positive for acute pulmonary embolus. 2.  Bilateral lower lobe air consolidation which may represent areas of pulmonary infarct or pneumonia 3. Bilateral pleural effusions.  Critical Value/emergent results were called by  telephone at the time of interpretation on 04/29/2011  at 6:06 p.m.  to  Dr. Lynelle Doctor, who verbally acknowledged these results.  Original Report Authenticated By: Rosealee Albee, M.D.    Medications: Scheduled Meds:   . azithromycin  500 mg Oral Once  . azithromycin  500 mg Oral Daily  . cefTRIAXone (ROCEPHIN)  IV  1 g Intravenous Q24H  . heparin  2,500 Units Intravenous Once  . heparin  4,000 Units Intravenous Once  . levothyroxine  75 mcg Oral QAC breakfast  . patient's guide to using coumadin book   Does not apply Once  . sodium chloride  3 mL Intravenous Q12H  . sodium chloride  3 mL Intravenous Q12H  . warfarin  7.5 mg Oral Once  . warfarin  7.5 mg Oral ONCE-1800  . warfarin   Does not apply Once  . Warfarin - Pharmacist Dosing Inpatient   Does not apply q1800  . DISCONTD: azithromycin  500 mg Intravenous Q24H   Continuous Infusions:   . heparin 1,100 Units/hr (05/10/11 1107)  . 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   Acute PE Patient admitted to tele Underlying cause likely recent surgery and post op  she is otherwise hemodynamically stable except for occasional chest discomfort and mild tachycardia on tele Cont IV heparin and coumadin ( dosing per pharmacy) Patient doesnot have insurance or PCP. lovenox does not seem a good option for her but will follow up with [facility]  will need to arrange for this on d/c  Will need AC for at least 6 months Doppler LE negative for DVT   Possible associated PNA  cont abx Follow wbc  Diet: regular  Full code   LOS: 1 day   Elvina Bosch 05/10/2011, 1:23 PM

## 2011-05-10 NOTE — Progress Notes (Signed)
Utilization Review Completed.Lissa Rowles T4/02/2011   

## 2011-05-11 LAB — BASIC METABOLIC PANEL
CO2: 26 mEq/L (ref 19–32)
Calcium: 9.4 mg/dL (ref 8.4–10.5)
Chloride: 95 mEq/L — ABNORMAL LOW (ref 96–112)
Glucose, Bld: 145 mg/dL — ABNORMAL HIGH (ref 70–99)
Potassium: 3.4 mEq/L — ABNORMAL LOW (ref 3.5–5.1)
Sodium: 134 mEq/L — ABNORMAL LOW (ref 135–145)

## 2011-05-11 LAB — HEPARIN LEVEL (UNFRACTIONATED): Heparin Unfractionated: 0.34 IU/mL (ref 0.30–0.70)

## 2011-05-11 LAB — CBC
HCT: 31.1 % — ABNORMAL LOW (ref 36.0–46.0)
Hemoglobin: 10.8 g/dL — ABNORMAL LOW (ref 12.0–15.0)
MCH: 25.8 pg — ABNORMAL LOW (ref 26.0–34.0)
MCV: 74.4 fL — ABNORMAL LOW (ref 78.0–100.0)
Platelets: 495 10*3/uL — ABNORMAL HIGH (ref 150–400)
RBC: 4.18 MIL/uL (ref 3.87–5.11)
WBC: 9.9 10*3/uL (ref 4.0–10.5)

## 2011-05-11 LAB — CARDIAC PANEL(CRET KIN+CKTOT+MB+TROPI)
Relative Index: INVALID (ref 0.0–2.5)
Total CK: 36 U/L (ref 7–177)
Troponin I: <0.3 ng/mL (ref <0.30)

## 2011-05-11 LAB — MAGNESIUM: Magnesium: 2.2 mg/dL (ref 1.5–2.5)

## 2011-05-11 LAB — PROTIME-INR: INR: 1.47 (ref 0.00–1.49)

## 2011-05-11 MED ORDER — WARFARIN SODIUM 7.5 MG PO TABS
7.5000 mg | ORAL_TABLET | Freq: Once | ORAL | Status: AC
  Administered 2011-05-11: 7.5 mg via ORAL
  Filled 2011-05-11: qty 1

## 2011-05-11 MED ORDER — HEPARIN BOLUS VIA INFUSION
1500.0000 [IU] | Freq: Once | INTRAVENOUS | Status: AC
  Administered 2011-05-11: 1500 [IU] via INTRAVENOUS
  Filled 2011-05-11: qty 1500

## 2011-05-11 MED ORDER — POTASSIUM CHLORIDE CRYS ER 20 MEQ PO TBCR
40.0000 meq | EXTENDED_RELEASE_TABLET | Freq: Once | ORAL | Status: AC
  Administered 2011-05-11: 40 meq via ORAL

## 2011-05-11 MED ORDER — OXYCODONE HCL 5 MG PO TABS
5.0000 mg | ORAL_TABLET | Freq: Four times a day (QID) | ORAL | Status: DC | PRN
  Administered 2011-05-13: 5 mg via ORAL
  Filled 2011-05-11: qty 1

## 2011-05-11 MED ORDER — POTASSIUM CHLORIDE CRYS ER 20 MEQ PO TBCR
EXTENDED_RELEASE_TABLET | ORAL | Status: AC
  Filled 2011-05-11: qty 2

## 2011-05-11 NOTE — Progress Notes (Signed)
ANTICOAGULATION CONSULT NOTE - Follow Up Consult  Pharmacy Consult for Heparin Indication: pulmonary embolus  Allergies  Allergen Reactions  . Shellfish Allergy Anaphylaxis    Patient Measurements: Height: 5' 6.5" (168.9 cm) Weight: 133 lb 2.5 oz (60.4 kg) IBW/kg (Calculated) : 60.45  Heparin Dosing Weight: 60 kg  Vital Signs: Temp: 97.9 F (36.6 C) (04/02 1335) Temp src: Oral (04/02 1335) BP: 113/76 mmHg (04/02 1335) Pulse Rate: 95  (04/02 1335)  Labs:  Basename 05/11/11 1332 05/11/11 0840 05/11/11 0555 05/10/11 1725 05/10/11 0048 05/09/11 1542  HGB -- -- 10.8* -- 10.3* --  HCT -- -- 31.1* -- 29.6* 33.7*  PLT -- -- 495* -- 415* 447*  APTT -- -- -- -- -- --  LABPROT -- -- 18.1* -- 15.2 13.8  INR -- -- 1.47 -- 1.18 1.04  HEPARINUNFRC 0.34 -- 0.19* 0.43 -- --  CREATININE -- 0.55 -- -- 0.48* 0.60  CKTOTAL 36 -- -- -- -- --  CKMB 0.8 -- -- -- -- --  TROPONINI <0.30 -- -- -- -- --   Estimated Creatinine Clearance: 77.5 ml/min (by C-G formula based on Cr of 0.55).  Assessment: 56 yo F s/p hysterectomy 10 days prior to admission presents with new PE.  She was started on heparin/warfarin. Today is Day#2/5 overlap. No bleeding noted.  Repeat heparin level therapeutic.     Goal of Therapy:  Heparin level 0.3-0.7 units/ml   Plan:  1) Continue Heparin infusion at 1200 units/hr. 2) F/U daily labs and routine pltc monitoring per protocol    Erianna Jolly, [facility], PharmD 05/11/2011,2:45 PM

## 2011-05-11 NOTE — Progress Notes (Signed)
Pt had 9 beats of VTACH pt is asymptomatic and vitals are stable,will continue to monitor.

## 2011-05-11 NOTE — Progress Notes (Signed)
Subjective: Patient seen and examined this am. Still has some pleuritic chest pain improved with vicodin but it makes her sweat a lot. Tachy to 120s on tele  Objective:  Vital signs in last 24 hours:  Filed Vitals:   05/10/11 1953 05/11/11 0517 05/11/11 0657 05/11/11 1035  BP: 126/82 138/86 137/83   Pulse: 78 98 101 105  Temp: 97.4 F (36.3 C) 98.3 F (36.8 C) 98.9 F (37.2 C)   TempSrc: Oral Oral Oral   Resp: 18 19 17 20   Height:      Weight:      SpO2: 98% 96% 95% 93%    Intake/Output from previous day:   Intake/Output Summary (Last 24 hours) at 05/11/11 1311 Last data filed at 05/11/11 1100  Gross per 24 hour  Intake 921.12 ml  Output   1000 ml  Net -78.88 ml    Physical Exam:  General: middle aged female in no acute distress.  HEENT: no pallor, no icterus, moist oral mucosa, no JVD, no lymphadenopathy  Heart: Normal s1 &s2 tachycardic, without murmurs, rubs, gallops.  Lungs: Clear to auscultation bilaterally.  Abdomen: staples from recent laprotomy intact, Soft, nontender, nondistended, positive bowel sounds.  Extremities: No clubbing cyanosis or edema with positive pedal pulses.No leg swellings  Neuro: Alert, awake, oriented x3, nonfocal.    Lab Results:  Basic Metabolic Panel:    Component Value Date/Time   NA 134* 05/11/2011 0840   K 3.4* 05/11/2011 0840   CL 95* 05/11/2011 0840   CO2 26 05/11/2011 0840   BUN 5* 05/11/2011 0840   CREATININE 0.55 05/11/2011 0840   GLUCOSE 145* 05/11/2011 0840   CALCIUM 9.4 05/11/2011 0840   CBC:    Component Value Date/Time   WBC 9.9 05/11/2011 0555   HGB 10.8* 05/11/2011 0555   HCT 31.1* 05/11/2011 0555   PLT 495* 05/11/2011 0555   MCV 74.4* 05/11/2011 0555   NEUTROABS 1.8 04/30/2007 1125   LYMPHSABS 2.3 04/30/2007 1125   MONOABS 0.4 04/30/2007 1125   EOSABS 0.1 04/30/2007 1125   BASOSABS 0.0 04/30/2007 1125    No results found for this or any previous visit (from the past 240 hour(s)).  Studies/Results: Dg Chest 2  View  05/09/2011  *RADIOLOGY REPORT*  Clinical Data: Abdominal hysterectomy now with chest pain and back pain  CHEST - 2 VIEW  Comparison: 04/30/2007  Findings:   Heart size appears normal.  There are bilateral pleural effusions right greater than left.  Airspace consolidation within the right lower lobe is identified consistent with pneumonia.  IMPRESSION:  1.  Right lower lobe airspace consolidation compatible with pneumonia. 2.  Bilateral pleural effusions, right greater than left.  Original Report Authenticated By: Rosealee Albee, M.D.   Ct Angio Chest W/cm &/or Wo Cm  05/09/2011  *RADIOLOGY REPORT*  Clinical Data: Chest pain.  Elevated D-dimer.  Recent surgery.  CT ANGIOGRAPHY CHEST  Technique:  Multidetector CT imaging of the chest using the standard protocol during bolus administration of intravenous contrast. Multiplanar reconstructed images including MIPs were obtained and reviewed to evaluate the vascular anatomy.  Contrast: OMNIPAQUE IOHEXOL 350 MG/ML IV SOLN  Comparison: None.  Findings: No enlarged axillary or supraclavicular lymph nodes.  There is no enlarged mediastinal or hilar adenopathy.  No pericardial effusion.  Bilateral pleural effusions are noted right greater than left.  There are abnormal filling defects within the left lower lobe pulmonary artery.  No filling defect to the right lower lobe pulmonary artery.  Bilateral  lower lobe airspace consolidation is identified worse in the right lung.  This may represent the sequela of pulmonary infarct or bacterial pneumonia.  Review of the visualized osseous structures shows a mild scoliosis deformity which is convex to the left.  Limited imaging through the upper abdomen is unremarkable.  IMPRESSION:  1.  Examination is positive for acute pulmonary embolus. 2.  Bilateral lower lobe air consolidation which may represent areas of pulmonary infarct or pneumonia 3. Bilateral pleural effusions.  Critical Value/emergent results were called by  telephone at the time of interpretation on 04/29/2011  at 6:06 p.m.  to  Dr. Lynelle Doctor, who verbally acknowledged these results.  Original Report Authenticated By: Rosealee Albee, M.D.    Medications: Scheduled Meds:   . azithromycin  500 mg Oral Daily  . cefTRIAXone (ROCEPHIN)  IV  1 g Intravenous Q24H  . heparin  1,500 Units Intravenous Once  . levothyroxine  75 mcg Oral QAC breakfast  . potassium chloride SA      . potassium chloride  40 mEq Oral Once  . sodium chloride  3 mL Intravenous Q12H  . sodium chloride  3 mL Intravenous Q12H  . warfarin  7.5 mg Oral ONCE-1800  . warfarin  7.5 mg Oral ONCE-1800  . Warfarin - Pharmacist Dosing Inpatient   Does not apply q1800   Continuous Infusions:   . heparin 1,200 Units/hr (05/11/11 0832)   PRN Meds:.sodium chloride, morphine, ondansetron (ZOFRAN) IV, ondansetron, oxyCODONE, senna, sodium chloride, DISCONTD: HYDROcodone-acetaminophen   Assessment/Plan:  56 Y/O Female with hx of hypothyroidism,MVP and MR from echo in 2009 , recent hysterectomy 10 days back for fibroid presented with pleuritic chest pain secondary to underlying PE.   Acute PE  Patient admitted to tele  Underlying cause likely recent surgery and post op  Still has some chest pain and tachycardia on tele, ordered 2 D echo to check for RV strain Cont IV heparin and coumadin ( dosing per pharmacy) , due to insurance issues, lovenox does not seem to be an appropriate choice Patient doesnot have insurance or PCP. SW will arrange for outpt follow up with her coumadin and INR Will need AC for at least 6 months  Doppler LE negative for DVT   Patient had a short run [place] on tele. Low k noted and replenished. Mg wnl. TSH wnl  Possible associated PNA  cont abx ( Day 3)    DIET: regular  Full code   LOS: 2 days   Telena Peyser 05/11/2011, 1:11 PM

## 2011-05-11 NOTE — Progress Notes (Signed)
MD office called and said to take out staples from patients hysterectomy site on Thursday April 4th at the hospital since she will still be here per Madaline Guthrie, MD.

## 2011-05-11 NOTE — Progress Notes (Signed)
ANTICOAGULATION CONSULT NOTE - Follow Up Consult  Pharmacy Consult for Heparin/Warfarin Indication: pulmonary embolus  Allergies  Allergen Reactions  . Shellfish Allergy Anaphylaxis    Patient Measurements: Height: 5' 6.5" (168.9 cm) Weight: 133 lb 2.5 oz (60.4 kg) IBW/kg (Calculated) : 60.45  Heparin Dosing Weight: 60 kg  Vital Signs: Temp: 98.9 F (37.2 C) (04/02 0657) Temp src: Oral (04/02 0657) BP: 137/83 mmHg (04/02 0657) Pulse Rate: 101  (04/02 0657)  Labs:  Alvira Philips 05/11/11 0555 05/10/11 1725 05/10/11 0837 05/10/11 0048 05/09/11 1542  HGB 10.8* -- -- 10.3* --  HCT 31.1* -- -- 29.6* 33.7*  PLT 495* -- -- 415* 447*  APTT -- -- -- -- --  LABPROT 18.1* -- -- 15.2 13.8  INR 1.47 -- -- 1.18 1.04  HEPARINUNFRC 0.19* 0.43 0.72* -- --  CREATININE -- -- -- 0.48* 0.60  CKTOTAL -- -- -- -- --  CKMB -- -- -- -- --  TROPONINI -- -- -- -- --   Estimated Creatinine Clearance: 77.5 ml/min (by C-G formula based on Cr of 0.48).  Assessment: 55 yo F s/p hysterectomy 10 days prior to admission presents with new PE.  She was started on heparin/warfarin. Today is Day#2/5 overlap. No bleeding noted.  Heparin level now slightly <goal-no interruptions or complications with infusion noted. INR rising to goal.    Pt also started on empiric Rocephin/Zithromax for PNA-like sx. Pt is afebrile & tolerating regular diet/oral meds.     Goal of Therapy:  Heparin level 0.3-0.7 units/ml; INR 2-3   Plan:  1) Heparin 1500 unit IV bolus x1 then iecrease heparin drip to 1200 units/hr.  Recheck 6hr heparin level. 2) Continue warfarin 7.5mg  po x 1 dose today.  3) F/U daily labs and routine pltc monitoring per protocol 4) Consider add stop date to Rocephin/Zithromax after 4/4 doses (5 days of tx) 5) Consider change IV heparin to sq Lovenox 90mg  sq daily and discharge patient home on Lovenox/warfarin once medically appropriate.     Elson Clan, PharmD  05/11/2011,8:15 AM

## 2011-05-12 DIAGNOSIS — I059 Rheumatic mitral valve disease, unspecified: Secondary | ICD-10-CM

## 2011-05-12 LAB — HEPARIN LEVEL (UNFRACTIONATED): Heparin Unfractionated: 0.15 IU/mL — ABNORMAL LOW (ref 0.30–0.70)

## 2011-05-12 LAB — CBC
Hemoglobin: 10.3 g/dL — ABNORMAL LOW (ref 12.0–15.0)
MCH: 25.6 pg — ABNORMAL LOW (ref 26.0–34.0)
MCHC: 35.2 g/dL (ref 30.0–36.0)
RDW: 13.4 % (ref 11.5–15.5)

## 2011-05-12 MED ORDER — HEPARIN BOLUS VIA INFUSION
1500.0000 [IU] | Freq: Once | INTRAVENOUS | Status: AC
  Administered 2011-05-12: 1500 [IU] via INTRAVENOUS
  Filled 2011-05-12: qty 1500

## 2011-05-12 MED ORDER — WARFARIN SODIUM 5 MG PO TABS
5.0000 mg | ORAL_TABLET | Freq: Once | ORAL | Status: AC
  Administered 2011-05-12: 5 mg via ORAL
  Filled 2011-05-12: qty 1

## 2011-05-12 NOTE — Progress Notes (Signed)
ANTICOAGULATION CONSULT NOTE - Follow Up Consult  Pharmacy Consult for Heparin Indication: pulmonary embolus  Allergies  Allergen Reactions  . Shellfish Allergy Anaphylaxis    Patient Measurements: Height: 5' 6.5" (168.9 cm) Weight: 133 lb 2.5 oz (60.4 kg) IBW/kg (Calculated) : 60.45  Heparin Dosing Weight: 60 kg  Vital Signs: Temp: 98 F (36.7 C) (04/03 1353) Temp src: Oral (04/03 1353) BP: 148/84 mmHg (04/03 1353) Pulse Rate: 84  (04/03 1353)  Labs:  Basename 05/12/11 1419 05/12/11 0500 05/11/11 1332 05/11/11 0840 05/11/11 0555 05/10/11 0048  HGB -- 10.3* -- -- 10.8* --  HCT -- 29.3* -- -- 31.1* 29.6*  PLT -- 548* -- -- 495* 415*  APTT -- -- -- -- -- --  LABPROT -- 23.0* -- -- 18.1* 15.2  INR -- 2.00* -- -- 1.47 1.18  HEPARINUNFRC 0.33 0.15* 0.34 -- -- --  CREATININE -- -- -- 0.55 -- 0.48*  CKTOTAL -- -- 36 -- -- --  CKMB -- -- 0.8 -- -- --  TROPONINI -- -- <0.30 -- -- --   Estimated Creatinine Clearance: 77.5 ml/min (by C-G formula based on Cr of 0.55).  Assessment: 56 yo F s/p hysterectomy 10 days prior to admission presents with new PE.  She was started on heparin/warfarin. Today is Day#3/5 overlap. No bleeding noted.  Repeat heparin level therapeutic. INR is therapeutic x 1 today.     Goal of Therapy:  Heparin level 0.3-0.7 units/ml   Plan:  1) Continue Heparin infusion at 1400 units/hr. 2) F/U daily labs and routine pltc monitoring per protocol *3) If patient stable for discharge tomorrow, could consider Lovenox 90mg  sq x1 dose prior to discharge to complete 5 day overlap with warfarin. Recommend d/c home on warfarin 5mg  po daily with close INR monitoring.     Elson Clan, PharmD 05/12/2011,3:43 PM

## 2011-05-12 NOTE — Progress Notes (Signed)
Subjective: No new complaints.  Objective: Weight change:   Intake/Output Summary (Last 24 hours) at 05/12/11 1100 Last data filed at 05/12/11 0900  Gross per 24 hour  Intake    800 ml  Output      0 ml  Net    800 ml    Filed Vitals:   05/12/11 0424  BP: 137/82  Pulse: 100  Temp: 99 F (37.2 C)  Resp: 20  General: middle aged female in no acute distress.  HEENT: no pallor, no icterus, moist oral mucosa, no JVD, no lymphadenopathy  Heart: Normal s1 &s2 tachycardic, without murmurs, rubs, gallops.  Lungs: Clear to auscultation bilaterally.  Abdomen: staples from recent laprotomy intact, Soft, nontender, nondistended, positive bowel sounds.  Extremities: No clubbing cyanosis or edema with positive pedal pulses.No leg swellings  Neuro: Alert, awake, oriented x3, nonfocal.   Lab Results: Results for orders placed during the hospital encounter of 05/09/11 (from the past 24 hour(s))  HEPARIN LEVEL (UNFRACTIONATED)     Status: Normal   Collection Time   05/11/11  1:32 PM      Component Value Range   Heparin Unfractionated 0.34  0.30 - 0.70 (IU/mL)  CARDIAC PANEL(CRET KIN+CKTOT+MB+TROPI)     Status: Normal   Collection Time   05/11/11  1:32 PM      Component Value Range   Total CK 36  7 - 177 (U/L)   CK, MB 0.8  0.3 - 4.0 (ng/mL)   Troponin I <0.30  <0.30 (ng/mL)   Relative Index RELATIVE INDEX IS INVALID  0.0 - 2.5   CBC     Status: Abnormal   Collection Time   05/12/11  5:00 AM      Component Value Range   WBC 10.2  4.0 - 10.5 (K/uL)   RBC 4.03  3.87 - 5.11 (MIL/uL)   Hemoglobin 10.3 (*) 12.0 - 15.0 (g/dL)   HCT 14.7 (*) 82.9 - 46.0 (%)   MCV 72.7 (*) 78.0 - 100.0 (fL)   MCH 25.6 (*) 26.0 - 34.0 (pg)   MCHC 35.2  30.0 - 36.0 (g/dL)   RDW 56.2  13.0 - 86.5 (%)   Platelets 548 (*) 150 - 400 (K/uL)  HEPARIN LEVEL (UNFRACTIONATED)     Status: Abnormal   Collection Time   05/12/11  5:00 AM      Component Value Range   Heparin Unfractionated 0.15 (*) 0.30 - 0.70 (IU/mL)    PROTIME-INR     Status: Abnormal   Collection Time   05/12/11  5:00 AM      Component Value Range   Prothrombin Time 23.0 (*) 11.6 - 15.2 (seconds)   INR 2.00 (*) 0.00 - 1.49      Micro Results: No results found for this or any previous visit (from the past 240 hour(s)).  Studies/Results: Dg Chest 2 View  05/09/2011  *RADIOLOGY REPORT*  Clinical Data: Abdominal hysterectomy now with chest pain and back pain  CHEST - 2 VIEW  Comparison: 04/30/2007  Findings:   Heart size appears normal.  There are bilateral pleural effusions right greater than left.  Airspace consolidation within the right lower lobe is identified consistent with pneumonia.  IMPRESSION:  1.  Right lower lobe airspace consolidation compatible with pneumonia. 2.  Bilateral pleural effusions, right greater than left.  Original Report Authenticated By: Rosealee Albee, M.D.   Ct Angio Chest W/cm &/or Wo Cm  05/09/2011  *RADIOLOGY REPORT*  Clinical Data: Chest pain.  Elevated D-dimer.  Recent surgery.  CT ANGIOGRAPHY CHEST  Technique:  Multidetector CT imaging of the chest using the standard protocol during bolus administration of intravenous contrast. Multiplanar reconstructed images including MIPs were obtained and reviewed to evaluate the vascular anatomy.  Contrast: OMNIPAQUE IOHEXOL 350 MG/ML IV SOLN  Comparison: None.  Findings: No enlarged axillary or supraclavicular lymph nodes.  There is no enlarged mediastinal or hilar adenopathy.  No pericardial effusion.  Bilateral pleural effusions are noted right greater than left.  There are abnormal filling defects within the left lower lobe pulmonary artery.  No filling defect to the right lower lobe pulmonary artery.  Bilateral lower lobe airspace consolidation is identified worse in the right lung.  This may represent the sequela of pulmonary infarct or bacterial pneumonia.  Review of the visualized osseous structures shows a mild scoliosis deformity which is convex to the left.   Limited imaging through the upper abdomen is unremarkable.  IMPRESSION:  1.  Examination is positive for acute pulmonary embolus. 2.  Bilateral lower lobe air consolidation which may represent areas of pulmonary infarct or pneumonia 3. Bilateral pleural effusions.  Critical Value/emergent results were called by telephone at the time of interpretation on 04/29/2011  at 6:06 p.m.  to  Dr. Lynelle Doctor, who verbally acknowledged these results.  Original Report Authenticated By: Rosealee Albee, M.D.   Medications: Scheduled Meds:   . azithromycin  500 mg Oral Daily  . cefTRIAXone (ROCEPHIN)  IV  1 g Intravenous Q24H  . heparin  1,500 Units Intravenous Once  . levothyroxine  75 mcg Oral QAC breakfast  . potassium chloride SA      . potassium chloride  40 mEq Oral Once  . sodium chloride  3 mL Intravenous Q12H  . warfarin  5 mg Oral ONCE-1800  . warfarin  7.5 mg Oral ONCE-1800  . Warfarin - Pharmacist Dosing Inpatient   Does not apply q1800  . DISCONTD: sodium chloride  3 mL Intravenous Q12H   Continuous Infusions:   . heparin 1,400 Units/hr (05/12/11 0727)   PRN Meds:.sodium chloride, morphine, ondansetron (ZOFRAN) IV, ondansetron, oxyCODONE, senna, sodium chloride, DISCONTD: HYDROcodone-acetaminophen  Assessment/Plan: Patient Active Hospital Problem List: 56 Y/O Female with hx of hypothyroidism,MVP and MR from echo in 2009 , recent hysterectomy 10 days back for fibroid presented with pleuritic chest pain secondary to underlying PE.  Acute PE  Patient admitted to tele  Underlying cause likely recent surgery and post op  Still has some chest pain and tachycardia on tele, ordered 2 D echo to check for RV strain  And results pending.  Cont IV heparin and coumadin ( dosing per pharmacy) , due to insurance issues, lovenox does not seem to be an appropriate choice  Patient doesnot have insurance or PCP. SW will arrange for outpt follow up with her coumadin and INR  Will need AC for at least 6 months    Doppler LE negative for DVT   Possible associated PNA  cont abx ( Day 4)    LOS: 3 days   [name] 05/12/2011, 11:00 AM

## 2011-05-12 NOTE — Progress Notes (Signed)
ANTICOAGULATION CONSULT NOTE - Follow Up Consult  Pharmacy Consult for Heparin Indication: pulmonary embolus  Allergies  Allergen Reactions  . Shellfish Allergy Anaphylaxis    Patient Measurements: Height: 5' 6.5" (168.9 cm) Weight: 133 lb 2.5 oz (60.4 kg) IBW/kg (Calculated) : 60.45  Heparin Dosing Weight: 60 kg  Vital Signs: Temp: 99 F (37.2 C) (04/03 0424) Temp src: Oral (04/03 0424) BP: 137/82 mmHg (04/03 0424) Pulse Rate: 100  (04/03 0424)  Labs:  Basename 05/12/11 0500 05/11/11 1332 05/11/11 0840 05/11/11 0555 05/10/11 0048 05/09/11 1542  HGB 10.3* -- -- 10.8* -- --  HCT 29.3* -- -- 31.1* 29.6* --  PLT 548* -- -- 495* 415* --  APTT -- -- -- -- -- --  LABPROT 23.0* -- -- 18.1* 15.2 --  INR 2.00* -- -- 1.47 1.18 --  HEPARINUNFRC 0.15* 0.34 -- 0.19* -- --  CREATININE -- -- 0.55 -- 0.48* 0.60  CKTOTAL -- 36 -- -- -- --  CKMB -- 0.8 -- -- -- --  TROPONINI -- <0.30 -- -- -- --   Estimated Creatinine Clearance: 77.5 ml/min (by C-G formula based on Cr of 0.55).  Assessment: 56 yo F s/p hysterectomy 10 days prior to admission presents with new PE.  She was started on heparin/warfarin. Today is Day#3/5 overlap. Heparin level is below-goal. No problem with line / infusion per RN. INR is at lower-end of goal range.     Goal of Therapy:  Heparin level 0.3-0.7 units/ml   Plan:  1. Heparin IV bolus of 1500 units x 1, then IV heparin infusion to 1400 units/hr. 2. Heparin level in 6 hours.  3. Coumadin 5mg  po today.     Emeline Gins, PharmD 05/12/2011,7:02 AM

## 2011-05-12 NOTE — Progress Notes (Signed)
   CARE MANAGEMENT NOTE 05/12/2011  Patient:  [name], [name]   Account Number:  [id number]  Date Initiated:  05/10/2011  Documentation initiated by:  [name]  Subjective/Objective Assessment:   Pt admitted with PE, PNA     Action/Plan:   PTA pt lived at home with spouse, was independent with ADLs   Anticipated DC Date:  05/12/2011   Anticipated DC Plan:  HOME/SELF CARE      DC Planning Services  CM consult  Medication Assistance      Choice offered to / List presented to:             Status of service:  In process, will continue to follow Medicare Important Message given?   (If response is "NO", the following Medicare IM given date fields will be blank) Date Medicare IM given:   Date Additional Medicare IM given:    Discharge Disposition:    Per UR Regulation:    If discussed at Long Length of Stay Meetings, dates discussed:    Comments:  05/12/11- 1400- Brenda Pierini RN, BSN [phone] Spoke with pt at bedside regarding plans for PCP f/u for coumadin PT/INR checks. Had called [facility] per pt request and out of pocket cost for first visit would be $223, and then for following visits cost would be $96-196 depending on what pt needed. Pt states she can not afford that right now. [facility] can follow coumadin but per clinic they are not making appointments right now, they are only doing walk ins to see doctor on Tues/Thur/Fridays between 1pm-3 pm with a $45 cost to see MD, lab cost is an extra $14.63. Discussed with pt if this would be an option for her- pt stated that she would be able to go to the [facility]- she will plan to go to clinic on Friday at 1 pm if discharged on 05/13/11-  info given to pt on [employer] including address and phone number.   05/10/11- 1515- Brenda Pierini RN, BSN [phone] Spoke with pt at bedside- per conversation pt states she lives at home with spouse- who travels frequently. Pt reports that she will have  transportation home. Pt reports that she does not have a current PCP- but that she has gone to the [facility] on [address] in past- will call to see if they are taking new pts- and see if they will be able to f/u with pt on PT/INR for lovenox/coumadin. Pt also states that she goes to Dr. Margaretmary Bayley for her thyriod. Call made to Dr. Ophelia Charter office- spoke with Dr. Chestine Spore regarding possibility of following pt's PT/INR at discharge- he stated that he wouldn't mind if pt did not have any other doctor to follow it. Will check into [facility] and the [facility] first. Pt is agreeable to f/u with either clinic and reports that she can handle the fee of $45 for the [facility]. Pt states that she uses [facility] for her medications. CM to follow for d/c needs and planning

## 2011-05-12 NOTE — Progress Notes (Signed)
  Echocardiogram 2D Echocardiogram has been performed.  Cathie Beams Deneen 05/12/2011, 9:31 AM

## 2011-05-13 LAB — CBC
HCT: 30 % — ABNORMAL LOW (ref 36.0–46.0)
MCH: 25.5 pg — ABNORMAL LOW (ref 26.0–34.0)
MCV: 72.8 fL — ABNORMAL LOW (ref 78.0–100.0)
RBC: 4.12 MIL/uL (ref 3.87–5.11)
WBC: 8.9 10*3/uL (ref 4.0–10.5)

## 2011-05-13 LAB — PROTIME-INR: INR: 2.06 — ABNORMAL HIGH (ref 0.00–1.49)

## 2011-05-13 MED ORDER — ENOXAPARIN SODIUM 100 MG/ML ~~LOC~~ SOLN
1.5000 mg/kg | Freq: Once | SUBCUTANEOUS | Status: AC
  Administered 2011-05-13: 90 mg via SUBCUTANEOUS
  Filled 2011-05-13: qty 1

## 2011-05-13 MED ORDER — WARFARIN SODIUM 5 MG PO TABS: 5.0000 mg | ORAL_TABLET | Freq: Every day | ORAL | Status: DC

## 2011-05-13 MED ORDER — OXYCODONE HCL 5 MG PO TABS: 5.0000 mg | ORAL_TABLET | Freq: Four times a day (QID) | ORAL | Status: AC | PRN

## 2011-05-13 MED ORDER — LEVOFLOXACIN 750 MG PO TABS: 750.0000 mg | ORAL_TABLET | Freq: Every day | ORAL | Status: AC

## 2011-05-13 MED ORDER — WARFARIN SODIUM 5 MG PO TABS
5.0000 mg | ORAL_TABLET | Freq: Once | ORAL | Status: DC
  Filled 2011-05-13: qty 1

## 2011-05-13 MED FILL — Morphine Sulfate Inj 10 MG/ML: INTRAMUSCULAR | Qty: 1 | Status: AC

## 2011-05-13 NOTE — Progress Notes (Signed)
Utilization Review Completed.[name] T4/05/2011   

## 2011-05-13 NOTE — Progress Notes (Signed)
05/13/2011 11:38 AM Nursing Note Abdominal staples removed per orders. Steri Strips applied. Patient tolerated well. Will continue to monitor.  Tristain Daily, Blanchard Kelch

## 2011-05-13 NOTE — Progress Notes (Signed)
05/13/2011 1:38 PM Nursing Note Discharge avs form, rx, medications already taken today and those due this evening explained to patient and husband. Incision care and when to call md also discussed. D/c iv line. d/c tele. D/c home per orders.  [name], Blanchard Kelch

## 2011-05-13 NOTE — Discharge Summary (Signed)
DISCHARGE SUMMARY  [name]  MR#: [id number]  DOB:12-Oct-1955  Date of Admission: 05/09/2011 Date of Discharge: 05/13/2011  Attending Physician:Gara Kincade  Patient's PCP: [facility]  Consults: None  Discharge Diagnoses: Present on Admission:  .H/O hysterectomy for benign disease .Thyroid disease Pulmonary embolism Community acquired pneumonia   Initial presentation: HPI:  [name] is an 56 y.o. female with history of hypothyroidism, abdominal hysterectomy 10 days ago for fibroid, presents to [facility] complaining of pleuritic chest pain for several days and Evaluation in the emergency room included a chest x-ray which showed bilateral pleural effusions and right lower lobe consolidation. A d-dimer was elevated at 5.1, followup CT pulmonary angiogram confirmed left lower lobe acute pulmonary embolism, with bilateral lower consolidation suggestive of pulmonary infarct, but cannot exclude pneumonia, and bilateral pleural effusion. She was admitted for management of new PE, on superimposed pneumonia.     Hospital Course:  1. PULMONARY EMBOLISM; she was started on iv heparin, as she lovnenox did not seem an appropriate choice due to insurance issues and concomitantly started on coumadin. Her INR is therapeutic for 2 days so far. IV heparin is stopped and she is being discharged on coumadin and recommended to check INR on Friday to keep the inr between 2 to 3. She is also recommended to check INR once every week for the next four weeks to keep a close monitor over the INR , followed by INR check every month.   2. Community Acquired Pneumonia: pt completed 4 days of iv rocephin and Zithromax. She is being discharged on 3  More days levaquin. She denies any chest pain or sob.   3. Hypothyroidism; continue with home dose of levothyroxine.      Medication List  As of 05/13/2011 10:08 AM   TAKE these medications        acetaminophen 500 MG tablet   Commonly known as: TYLENOL   Take 1,000 mg by mouth every 6 (six) hours as needed. For pain      levofloxacin 750 MG tablet   Commonly known as: LEVAQUIN   Take 1 tablet (750 mg total) by mouth daily.      levothyroxine 75 MCG tablet   Commonly known as: SYNTHROID, LEVOTHROID   Take 75 mcg by mouth daily.      oxyCODONE 5 MG immediate release tablet   Commonly known as: Oxy IR/ROXICODONE   Take 1 tablet (5 mg total) by mouth every 6 (six) hours as needed.      senna 8.6 MG tablet   Commonly known as: SENOKOT   Take 1 tablet by mouth 2 (two) times daily as needed. For constipation      warfarin 5 MG tablet   Commonly known as: COUMADIN   Take 1 tablet (5 mg total) by mouth daily.             Day of Discharge BP 131/83  Pulse 107  Temp(Src) 97.4 F (36.3 C) (Oral)  Resp 20  Ht 5' 6.5" (1.689 m)  Wt 60.4 kg (133 lb 2.5 oz)  BMI 21.17 kg/m2  SpO2 97%  Physical Exam:  General: middle aged female in no acute distress.  HEENT: no pallor, no icterus, moist oral mucosa, no JVD, no lymphadenopathy  Heart: Normal s1 &s2 tachycardic, without murmurs, rubs, gallops.  Lungs: Clear to auscultation bilaterally.  Abdomen: staples from recent laprotomy intact, Soft, nontender, nondistended, positive bowel sounds.  Extremities: No clubbing cyanosis or edema  with positive pedal pulses.No leg swellings  Neuro: Alert, awake, oriented x3, nonfocal.  Results for orders placed during the hospital encounter of 05/09/11 (from the past 24 hour(s))  HEPARIN LEVEL (UNFRACTIONATED)     Status: Normal   Collection Time   05/12/11  2:19 PM      Component Value Range   Heparin Unfractionated 0.33  0.30 - 0.70 (IU/mL)  CBC     Status: Abnormal   Collection Time   05/13/11  5:20 AM      Component Value Range   WBC 8.9  4.0 - 10.5 (K/uL)   RBC 4.12  3.87 - 5.11 (MIL/uL)   Hemoglobin 10.5 (*) 12.0 - 15.0 (g/dL)   HCT 16.1 (*) 09.6 - 46.0 (%)   MCV 72.8 (*) 78.0 -  100.0 (fL)   MCH 25.5 (*) 26.0 - 34.0 (pg)   MCHC 35.0  30.0 - 36.0 (g/dL)   RDW 04.5  40.9 - 81.1 (%)   Platelets 610 (*) 150 - 400 (K/uL)  HEPARIN LEVEL (UNFRACTIONATED)     Status: Normal   Collection Time   05/13/11  5:20 AM      Component Value Range   Heparin Unfractionated 0.42  0.30 - 0.70 (IU/mL)  PROTIME-INR     Status: Abnormal   Collection Time   05/13/11  5:20 AM      Component Value Range   Prothrombin Time 23.6 (*) 11.6 - 15.2 (seconds)   INR 2.06 (*) 0.00 - 1.49     Disposition: Home   Follow-up Appts: Discharge Orders    Future Orders Please Complete By Expires   Diet general      Activity as tolerated - No restrictions      Discharge instructions      Comments:   Follow up INR  On Friday tomorrow at [facility].      Follow-up Information    Follow up with Jovita Kussmaul on 05/14/2011. (Pt needs to go between 1pm-3pm- clinic only taking walkins)    Contact information:   [phone]         Tests Needing Follow-up: INR check up.   Time spent in discharge (includes decision making & examination of pt): 48 minutes  Signed: Alonie Gazzola 05/13/2011, 10:08 AM

## 2011-05-13 NOTE — Progress Notes (Signed)
ANTICOAGULATION CONSULT NOTE - Follow Up Consult  Pharmacy Consult for Heparin Indication: pulmonary embolus  Allergies  Allergen Reactions  . Shellfish Allergy Anaphylaxis    Patient Measurements: Height: 5' 6.5" (168.9 cm) Weight: 133 lb 2.5 oz (60.4 kg) IBW/kg (Calculated) : 60.45  Heparin Dosing Weight: 60 kg  Vital Signs: Temp: 97.4 F (36.3 C) (04/04 0819) Temp src: Oral (04/04 0819) BP: 131/83 mmHg (04/04 0819) Pulse Rate: 107  (04/04 0819)  Labs:  Basename 05/13/11 0520 05/12/11 1419 05/12/11 0500 05/11/11 1332 05/11/11 0840 05/11/11 0555  HGB 10.5* -- 10.3* -- -- --  HCT 30.0* -- 29.3* -- -- 31.1*  PLT 610* -- 548* -- -- 495*  APTT -- -- -- -- -- --  LABPROT 23.6* -- 23.0* -- -- 18.1*  INR 2.06* -- 2.00* -- -- 1.47  HEPARINUNFRC 0.42 0.33 0.15* -- -- --  CREATININE -- -- -- -- 0.55 --  CKTOTAL -- -- -- 36 -- --  CKMB -- -- -- 0.8 -- --  TROPONINI -- -- -- <0.30 -- --   Estimated Creatinine Clearance: 77.5 ml/min (by C-G formula based on Cr of 0.55).  Assessment: 56 yo F s/p hysterectomy 10 days prior to admission presents with new PE.  She was started on heparin/warfarin. Today is Day#4/5 overlap. No bleeding noted.  heparin level therapeutic. INR is therapeutic x 2 today but still needs 1 more day of overlap therapy. Hgb and platelets are stable and no bleeding evident.     Goal of Therapy:  Heparin level 0.3-0.7 units/ml   Plan:  1) Continue Heparin infusion at 1400 units/hr. 2) F/U daily labs and routine pltc monitoring per protocol *3) If patient stable for discharge today, could consider Lovenox 90mg  sq x1 dose prior to discharge to complete 5 day overlap with warfarin. Recommend d/c home on warfarin 5mg  po daily with close INR monitoring.  *4) Suggest stop antibiotics as completed 5 days of therapy today    Jerianne Anselmo, Tad Moore, PharmD 05/13/2011,10:25 AM

## 2011-05-13 NOTE — Progress Notes (Signed)
   CARE MANAGEMENT NOTE 05/13/2011  Patient:  [name], [name]   Account Number:  [id number]  Date Initiated:  05/10/2011  Documentation initiated by:  Donn Pierini  Subjective/Objective Assessment:   Pt admitted with PE, PNA     Action/Plan:   PTA pt lived at home with spouse, was independent with ADLs   Anticipated DC Date:  05/13/2011   Anticipated DC Plan:  HOME/SELF CARE      DC Planning Services  CM consult  Medication Assistance      Choice offered to / List presented to:             Status of service:  Completed, signed off Medicare Important Message given?   (If response is "NO", the following Medicare IM given date fields will be blank) Date Medicare IM given:   Date Additional Medicare IM given:    Discharge Disposition:  HOME/SELF CARE  Per UR Regulation:    If discussed at Long Length of Stay Meetings, dates discussed:    Comments:  05/13/11 10:17 Letha Cape RN, BSN [phone] patient for discharge today, patient will go to [facility] on Friday at 1pm for inr check.  Patient has Risk analyst information.  Patient has transportation and she can afford her meds from [facility] fo $4, no other needs are identified.  05/12/11- 1400- Donn Pierini RN, BSN [phone] Spoke with pt at bedside regarding plans for PCP f/u for coumadin PT/INR checks. Had called [facility] per pt request and out of pocket cost for first visit would be $223, and then for following visits cost would be $96-196 depending on what pt needed. Pt states she can not afford that right now. [facility] can follow coumadin but per clinic they are not making appointments right now, they are only doing walk ins to see doctor on Tues/Thur/Fridays between 1pm-3 pm with a $45 cost to see MD, lab cost is an extra $14.63. Discussed with pt if this would be an option for her- pt stated that she would be able to go to the [facility]- she will plan to go to clinic on Friday at 1  pm if discharged on 05/13/11-  info given to pt on [employer] including address and phone number.   05/10/11- 1515- Donn Pierini RN, BSN [phone] Spoke with pt at bedside- per conversation pt states she lives at home with spouse- who travels frequently. Pt reports that she will have transportation home. Pt reports that she does not have a current PCP- but that she has gone to the [facility] on [address] in past- will call to see if they are taking new pts- and see if they will be able to f/u with pt on PT/INR for lovenox/coumadin. Pt also states that she goes to Dr. Margaretmary Bayley for her thyriod. Call made to Dr. Ophelia Charter office- spoke with Dr. Chestine Spore regarding possibility of following pt's PT/INR at discharge- he stated that he wouldn't mind if pt did not have any other doctor to follow it. Will check into [facility] and the [facility] first. Pt is agreeable to f/u with either clinic and reports that she can handle the fee of $45 for the [facility]. Pt states that she uses [facility] for her medications. CM to follow for d/c needs and planning

## 2011-06-09 ENCOUNTER — Encounter (HOSPITAL_BASED_OUTPATIENT_CLINIC_OR_DEPARTMENT_OTHER): Payer: Self-pay | Admitting: *Deleted

## 2011-06-09 ENCOUNTER — Emergency Department (HOSPITAL_BASED_OUTPATIENT_CLINIC_OR_DEPARTMENT_OTHER)
Admission: EM | Admit: 2011-06-09 | Discharge: 2011-06-10 | Disposition: A | Discharge status: Home / Self Care | Payer: Self-pay | Attending: Emergency Medicine | Admitting: Emergency Medicine

## 2011-06-09 DIAGNOSIS — E079 Disorder of thyroid, unspecified: Secondary | ICD-10-CM | POA: Insufficient documentation

## 2011-06-09 DIAGNOSIS — K219 Gastro-esophageal reflux disease without esophagitis: Secondary | ICD-10-CM | POA: Insufficient documentation

## 2011-06-09 DIAGNOSIS — Z9889 Other specified postprocedural states: Secondary | ICD-10-CM | POA: Insufficient documentation

## 2011-06-09 DIAGNOSIS — R079 Chest pain, unspecified: Secondary | ICD-10-CM | POA: Insufficient documentation

## 2011-06-09 HISTORY — DX: Other pulmonary embolism without acute cor pulmonale: I26.99

## 2011-06-09 NOTE — ED Notes (Signed)
Pt c/o " indigestion" x 1 day describes as squeezing

## 2011-06-09 NOTE — ED Notes (Signed)
SR on monitor, resps even and unlabored.

## 2011-06-10 ENCOUNTER — Emergency Department (INDEPENDENT_AMBULATORY_CARE_PROVIDER_SITE_OTHER): Payer: 59

## 2011-06-10 DIAGNOSIS — K219 Gastro-esophageal reflux disease without esophagitis: Secondary | ICD-10-CM

## 2011-06-10 LAB — DIFFERENTIAL
Basophils Absolute: 0 10*3/uL (ref 0.0–0.1)
Basophils Relative: 0 % (ref 0–1)
Lymphocytes Relative: 35 % (ref 12–46)
Neutro Abs: 3.1 10*3/uL (ref 1.7–7.7)
Neutrophils Relative %: 53 % (ref 43–77)

## 2011-06-10 LAB — CBC
Hemoglobin: 11.3 g/dL — ABNORMAL LOW (ref 12.0–15.0)
MCHC: 34.7 g/dL (ref 30.0–36.0)
RDW: 14.1 % (ref 11.5–15.5)
WBC: 5.8 10*3/uL (ref 4.0–10.5)

## 2011-06-10 LAB — BASIC METABOLIC PANEL
CO2: 28 mEq/L (ref 19–32)
Chloride: 105 mEq/L (ref 96–112)
GFR calc Af Amer: >90 mL/min (ref >90)
Potassium: 3.7 mEq/L (ref 3.5–5.1)

## 2011-06-10 LAB — PROTIME-INR
INR: 3.01 — ABNORMAL HIGH (ref 0.00–1.49)
Prothrombin Time: 31.7 seconds — ABNORMAL HIGH (ref 11.6–15.2)

## 2011-06-10 LAB — TROPONIN I: Troponin I: <0.3 ng/mL (ref <0.30)

## 2011-06-10 MED ORDER — ASPIRIN 81 MG PO CHEW
324.0000 mg | CHEWABLE_TABLET | Freq: Once | ORAL | Status: AC
  Administered 2011-06-10: 324 mg via ORAL
  Filled 2011-06-10: qty 4

## 2011-06-10 MED ORDER — PANTOPRAZOLE SODIUM 40 MG IV SOLR
40.0000 mg | Freq: Once | INTRAVENOUS | Status: AC
  Administered 2011-06-10: 40 mg via INTRAVENOUS
  Filled 2011-06-10: qty 40

## 2011-06-10 MED ORDER — LANSOPRAZOLE 15 MG PO CPDR: 15.0000 mg | DELAYED_RELEASE_CAPSULE | Freq: Every day | ORAL | Status: DC

## 2011-06-10 NOTE — Discharge Instructions (Signed)
Chest Pain (Nonspecific) It is often hard to give a specific diagnosis for the cause of chest pain. There is always a chance that your pain could be related to something serious, such as a heart attack or a blood clot in the lungs. You need to follow up with your caregiver for further evaluation. CAUSES   Heartburn.   Pneumonia or bronchitis.   Anxiety or stress.   Inflammation around your heart (pericarditis) or lung (pleuritis or pleurisy).   A blood clot in the lung.   A collapsed lung (pneumothorax). It can develop suddenly on its own (spontaneous pneumothorax) or from injury (trauma) to the chest.   Shingles infection (herpes zoster virus).  The chest wall is composed of bones, muscles, and cartilage. Any of these can be the source of the pain.  The bones can be bruised by injury.   The muscles or cartilage can be strained by coughing or overwork.   The cartilage can be affected by inflammation and become sore (costochondritis).  DIAGNOSIS  Lab tests or other studies, such as X-rays, electrocardiography, stress testing, or cardiac imaging, may be needed to find the cause of your pain.  TREATMENT   Treatment depends on what may be causing your chest pain. Treatment may include:   Acid blockers for heartburn.   Anti-inflammatory medicine.   Pain medicine for inflammatory conditions.   Antibiotics if an infection is present.   You may be advised to change lifestyle habits. This includes stopping smoking and avoiding alcohol, caffeine, and chocolate.   You may be advised to keep your head raised (elevated) when sleeping. This reduces the chance of acid going backward from your stomach into your esophagus.   Most of the time, nonspecific chest pain will improve within 2 to 3 days with rest and mild pain medicine.  HOME CARE INSTRUCTIONS   If antibiotics were prescribed, take your antibiotics as directed. Finish them even if you start to feel better.   For the next few  days, avoid physical activities that bring on chest pain. Continue physical activities as directed.   Do not smoke.   Avoid drinking alcohol.   Only take over-the-counter or prescription medicine for pain, discomfort, or fever as directed by your caregiver.   Follow your caregiver's suggestions for further testing if your chest pain does not go away.   Keep any follow-up appointments you made. If you do not go to an appointment, you could develop lasting (chronic) problems with pain. If there is any problem keeping an appointment, you must call to reschedule.  SEEK MEDICAL CARE IF:   You think you are having problems from the medicine you are taking. Read your medicine instructions carefully.   Your chest pain does not go away, even after treatment.   You develop a rash with blisters on your chest.  SEEK IMMEDIATE MEDICAL CARE IF:   You have increased chest pain or pain that spreads to your arm, neck, jaw, back, or abdomen.   You develop shortness of breath, an increasing cough, or you are coughing up blood.   You have severe back or abdominal pain, feel nauseous, or vomit.   You develop severe weakness, fainting, or chills.   You have a fever.  THIS IS AN EMERGENCY. Do not wait to see if the pain will go away. Get medical help at once. Call your local emergency services (911 in [place] Do not drive yourself to the hospital. MAKE SURE YOU:   Understand these instructions.     Will watch your condition.   Will get help right away if you are not doing well or get worse.  Document Released: 11/04/2004 Document Revised: 01/14/2011 Document Reviewed: 08/31/2007 Exit[employer] Patient Information 2012 [employer]. 

## 2011-06-10 NOTE — ED Notes (Signed)
MD at bedside. 

## 2011-06-10 NOTE — ED Provider Notes (Signed)
History     CSN: [id number]  Arrival date & time 06/09/11  2237   First MD Initiated Contact with Patient 06/10/11 0004      Chief Complaint  Patient presents with  .  chest pain    (Consider location/radiation/quality/duration/timing/severity/associated sxs/prior treatment) HPI This is a 56 year old black female who had abdominal hysterectomy March 29 which was complicated by a pulmonary embolism postoperativelyo. She is currently on Coumadin. She had episodes of chest pressure yesterday that were coming and going. She describes the symptoms as feeling like heartburn to. Symptoms were moderate at their worst. They were not exacerbated or mitigated by anything, notable it did not worsen with activity or improved rest. She did not take anything to treat him. There was no associated radiation, dyspnea or diaphoresis. She did have one episode of transient nausea. She denies a family history of coronary artery disease. She denies a personal history of hypertension, diabetes or heart disease.  Past Medical History  Diagnosis Date  . Thyroid disease   . PE (pulmonary embolism)     Past Surgical History  Procedure Date  . Abdominal hysterectomy     History reviewed. No pertinent family history.  History  Substance Use Topics  . Smoking status: Never Smoker   . Smokeless tobacco: Not on file  . Alcohol Use: No    OB History    Grav Para Term Preterm Abortions TAB SAB Ect Mult Living                  Review of Systems  All other systems reviewed and are negative.    Allergies  Shellfish allergy  Home Medications   Current Outpatient Rx  Name Route Sig Dispense Refill  . SENNOSIDES 8.6 MG PO TABS Oral Take 1 tablet by mouth 2 (two) times daily as needed. For constipation    . LEVOTHYROXINE SODIUM 75 MCG PO TABS Oral Take 75 mcg by mouth daily.      BP 152/91  Pulse 98  Temp(Src) 97.9 F (36.6 C) (Oral)  Resp 16  Ht 5\' 6"  (1.676 m)  Wt 129 lb (58.514 kg)  BMI  20.82 kg/m2  SpO2 100%  Physical Exam General: Well-developed, well-nourished female in no acute distress; appearance consistent with age of record HENT: normocephalic, atraumatic Eyes: Normal appearance Neck: supple Heart: regular rate and rhythm Lungs: clear to auscultation bilaterally Abdomen: soft; nondistended; nontender; bowel sounds present; well healing lower midline abdominal incision Extremities: No deformity; full range of motion; pulses normal; no edema or calf tenderness Neurologic: Awake, alert and oriented; motor function intact in all extremities and symmetric; no facial droop Skin: Warm and dry Psychiatric: Normal mood and affect    ED Course  Procedures (including critical care time)     MDM   Nursing notes and vitals signs, including pulse oximetry, reviewed.  Summary of this visit's results, reviewed by myself:  Labs:  Results for orders placed during the hospital encounter of 06/09/11  CBC      Component Value Range   WBC 5.8  4.0 - 10.5 (K/uL)   RBC 4.43  3.87 - 5.11 (MIL/uL)   Hemoglobin 11.3 (*) 12.0 - 15.0 (g/dL)   HCT 84.6 (*) 96.2 - 46.0 (%)   MCV 73.6 (*) 78.0 - 100.0 (fL)   MCH 25.5 (*) 26.0 - 34.0 (pg)   MCHC 34.7  30.0 - 36.0 (g/dL)   RDW 95.2  84.1 - 32.4 (%)   Platelets 303  150 - 400 (  K/uL)  DIFFERENTIAL      Component Value Range   Neutrophils Relative 53  43 - 77 (%)   Neutro Abs 3.1  1.7 - 7.7 (K/uL)   Lymphocytes Relative 35  12 - 46 (%)   Lymphs Abs 2.1  0.7 - 4.0 (K/uL)   Monocytes Relative 9  3 - 12 (%)   Monocytes Absolute 0.6  0.1 - 1.0 (K/uL)   Eosinophils Relative 2  0 - 5 (%)   Eosinophils Absolute 0.1  0.0 - 0.7 (K/uL)   Basophils Relative 0  0 - 1 (%)   Basophils Absolute 0.0  0.0 - 0.1 (K/uL)  BASIC METABOLIC PANEL      Component Value Range   Sodium 142  135 - 145 (mEq/L)   Potassium 3.7  3.5 - 5.1 (mEq/L)   Chloride 105  96 - 112 (mEq/L)   CO2 28  19 - 32 (mEq/L)   Glucose, Bld 134 (*) 70 - 99 (mg/dL)    BUN 8  6 - 23 (mg/dL)   Creatinine, Ser 2.13  0.50 - 1.10 (mg/dL)   Calcium 9.4  8.4 - 08.6 (mg/dL)   GFR calc non Af Amer >90  >90 (mL/min)   GFR calc Af Amer >90  >90 (mL/min)  PROTIME-INR      Component Value Range   Prothrombin Time 31.7 (*) 11.6 - 15.2 (seconds)   INR 3.01 (*) 0.00 - 1.49   TROPONIN I      Component Value Range   Troponin I <0.30  <0.30 (ng/mL)    Imaging Studies: Dg Chest 2 View  06/10/2011  *RADIOLOGY REPORT*  Clinical Data: Gastroesophageal reflux  CHEST - 2 VIEW  Comparison: 05/09/2011; 04/30/2007; chest CT - 05/09/2011  Findings: Unchanged cardiac silhouette and mediastinal contours. Interval resolution of previously noted small right-sided pleural effusion and bibasilar heterogeneous opacities.  No focal airspace opacities.  No pneumothorax.  Unchanged bones.  IMPRESSION:  1.  No acute cardiopulmonary disease. 2.  Interval resolution of right-sided effusion and bibasilar opacities.  Original Report Authenticated By: Waynard Reeds, M.D.      EKG Interpretation:  Date & Time: 06/10/2011 10:54 PM  Rate: 96  Rhythm: normal sinus rhythm  QRS Axis: normal  Intervals: normal  ST/T Wave abnormalities: normal  Conduction Disutrbances:none  Narrative Interpretation: Left atrial and left ventricular hypertrophy  Old EKG Reviewed: unchanged  1:46 AM Patient has been asymptomatic in the ED. Her symptomatology is atypical for cardiac pain. It is noted that she is therapeutic on her Coumadin. We will treat for gastric reflux and refer to cardiology for outpatient workup.          Hanley Seamen, MD 06/10/11 [phone]

## 2011-07-20 ENCOUNTER — Encounter: Payer: Self-pay | Admitting: *Deleted

## 2011-07-21 ENCOUNTER — Encounter: Payer: Self-pay | Admitting: Cardiovascular Disease

## 2011-07-21 ENCOUNTER — Ambulatory Visit (INDEPENDENT_AMBULATORY_CARE_PROVIDER_SITE_OTHER): Payer: Self-pay | Admitting: Cardiovascular Disease

## 2011-07-21 VITALS — BP 115/75 | HR 96 | Ht 66.0 in | Wt 134.0 lb

## 2011-07-21 DIAGNOSIS — I2699 Other pulmonary embolism without acute cor pulmonale: Secondary | ICD-10-CM

## 2011-07-21 DIAGNOSIS — R079 Chest pain, unspecified: Secondary | ICD-10-CM

## 2011-07-21 NOTE — Assessment & Plan Note (Signed)
Atypical.  R/O normal ECG F/U stress echo

## 2011-07-21 NOTE — Assessment & Plan Note (Signed)
F/U in our coumadin clinic.  If INR subRx consider CT.  No signs of cor pulmonale

## 2011-07-21 NOTE — Patient Instructions (Addendum)
Your physician has requested that you have a stress echocardiogram. For further information please visit [email or web address] Please follow instruction sheet as given.  Your physician recommends that you continue on your current medications as directed. Please refer to the Current Medication list given to you today.  You have been referred to our Coumadin Clinic.  We have you scheduled 07/22/11 at 10:30 am for a PT/INR check  Your physician recommends that you schedule a follow-up appointment in: September with Dr. Eden Emms

## 2011-07-21 NOTE — Progress Notes (Signed)
Patient ID: [name], female   DOB: [birth year], 56 y.o.   MRN: [id number] 56 yo referred by ER Jeannine Boga for chest pain.  ER notes reviewed 3 weeks ago.  Atypical pain relief with H2 blockers.  R/O ECG normal.  Had PE at end of March after hysterectomy done at [facility].  Needs to be on coumadin until September. Getting iv check at [facility] clinic which is not ideal and expensive.  INR been ok per patient.  No pain last two weeks.  Pain nonexertional.  NO associated dyspnea pleurisy or syncope.  No LE edema.  Compliant with meds.  No previous CAD  ROS: Denies fever, malais, weight loss, blurry vision, decreased visual acuity, cough, sputum, SOB, hemoptysis, pleuritic pain, palpitaitons, heartburn, abdominal pain, melena, lower extremity edema, claudication, or rash.  All other systems reviewed and negative   General: Affect appropriate Healthy:  appears stated age HEENT: normal Neck supple with no adenopathy JVP normal no bruits no thyromegaly Lungs clear with no wheezing and good diaphragmatic motion Heart:  S1/S2 no murmur,rub, gallop or click PMI normal Abdomen: benighn, BS positve, no tenderness, no AAA no bruit.  No HSM or HJR Distal pulses intact with no bruits No edema Neuro non-focal Skin warm and dry No muscular weakness  Medications Current Outpatient Prescriptions  Medication Sig Dispense Refill  . levothyroxine (SYNTHROID, LEVOTHROID) 75 MCG tablet Take 75 mcg by mouth daily.      Marland Kitchen warfarin (COUMADIN) 5 MG tablet Take 5 mg by mouth as directed.        Allergies Shellfish allergy  Family History: Family History  Problem Relation Age of Onset  . Diabetes    . Hypertension    . Cancer    . Asthma      Social History: History   Social History  . Marital Status: Married    Spouse Name: N/A    Number of Children: N/A  . Years of Education: N/A   Occupational History  . Not on file.   Social History Main Topics  . Smoking status: Never Smoker   .  Smokeless tobacco: Not on file  . Alcohol Use: No  . Drug Use: No  . Sexually Active:    Other Topics Concern  . Not on file   Social History Narrative  . No narrative on file    Electrocardiogram:  06/16/11 nsr normal ECG  Rate 96  Assessment and Plan

## 2011-07-21 NOTE — Assessment & Plan Note (Signed)
F/U primary for TSH q 6 months.  Continue replacement

## 2011-07-22 ENCOUNTER — Ambulatory Visit (INDEPENDENT_AMBULATORY_CARE_PROVIDER_SITE_OTHER): Payer: Self-pay | Admitting: *Deleted

## 2011-07-22 DIAGNOSIS — I2699 Other pulmonary embolism without acute cor pulmonale: Secondary | ICD-10-CM

## 2011-07-22 LAB — POCT INR: INR: 3.8

## 2011-07-27 ENCOUNTER — Telehealth: Payer: Self-pay | Admitting: Cardiovascular Disease

## 2011-07-27 NOTE — Telephone Encounter (Signed)
I spoke with the pt and made her aware that she can take OTC reflux medications.  I did instruct the pt that she cannot take Pepto-Bismol due to taking Coumadin.  The pt will start Prilosec and use Tums as needed.

## 2011-07-27 NOTE — Telephone Encounter (Signed)
Pt has had heartburn and she wants to know what she can take

## 2011-07-29 ENCOUNTER — Ambulatory Visit (HOSPITAL_COMMUNITY): Payer: Self-pay | Attending: Cardiology

## 2011-07-29 ENCOUNTER — Ambulatory Visit (HOSPITAL_COMMUNITY): Payer: Self-pay | Attending: Cardiology | Admitting: Radiology

## 2011-07-29 ENCOUNTER — Encounter: Payer: Self-pay | Admitting: Cardiovascular Disease

## 2011-07-29 DIAGNOSIS — R000 Tachycardia, unspecified: Secondary | ICD-10-CM | POA: Insufficient documentation

## 2011-07-29 DIAGNOSIS — I2699 Other pulmonary embolism without acute cor pulmonale: Secondary | ICD-10-CM | POA: Insufficient documentation

## 2011-07-29 DIAGNOSIS — R079 Chest pain, unspecified: Secondary | ICD-10-CM

## 2011-07-29 DIAGNOSIS — R0609 Other forms of dyspnea: Secondary | ICD-10-CM | POA: Insufficient documentation

## 2011-07-29 DIAGNOSIS — R0989 Other specified symptoms and signs involving the circulatory and respiratory systems: Secondary | ICD-10-CM

## 2011-07-29 DIAGNOSIS — R072 Precordial pain: Secondary | ICD-10-CM | POA: Insufficient documentation

## 2011-07-29 NOTE — Progress Notes (Signed)
Stress Echocardiogram performed.  

## 2011-08-02 ENCOUNTER — Ambulatory Visit (INDEPENDENT_AMBULATORY_CARE_PROVIDER_SITE_OTHER): Payer: Self-pay | Admitting: *Deleted

## 2011-08-02 DIAGNOSIS — I2699 Other pulmonary embolism without acute cor pulmonale: Secondary | ICD-10-CM

## 2011-08-04 ENCOUNTER — Encounter: Payer: Self-pay | Admitting: Cardiovascular Disease

## 2011-08-04 ENCOUNTER — Encounter: Payer: Self-pay | Admitting: *Deleted

## 2011-08-04 ENCOUNTER — Ambulatory Visit (INDEPENDENT_AMBULATORY_CARE_PROVIDER_SITE_OTHER): Payer: Self-pay | Admitting: Cardiovascular Disease

## 2011-08-04 VITALS — BP 137/78 | HR 105 | Ht 66.5 in | Wt 131.1 lb

## 2011-08-04 DIAGNOSIS — Z01810 Encounter for preprocedural cardiovascular examination: Secondary | ICD-10-CM

## 2011-08-04 DIAGNOSIS — R079 Chest pain, unspecified: Secondary | ICD-10-CM

## 2011-08-04 DIAGNOSIS — I2699 Other pulmonary embolism without acute cor pulmonale: Secondary | ICD-10-CM

## 2011-08-04 DIAGNOSIS — E079 Disorder of thyroid, unspecified: Secondary | ICD-10-CM

## 2011-08-04 MED ORDER — PREDNISONE 20 MG PO TABS: 20.0000 mg | ORAL_TABLET | Freq: Every day | ORAL | Status: AC

## 2011-08-04 NOTE — Assessment & Plan Note (Signed)
Continue chest pain with positive/equivacol stress echo.  Cath to be scheduled.  Will hold coumadin and Check INR morning of cath.  Dye allergy prophylaxis given shell fish allergy

## 2011-08-04 NOTE — Progress Notes (Signed)
Patient ID: [name], female   DOB: August 30, 1955, 56 y.o.   MRN: [id number] 56 yo referred by ER Jeannine Boga for chest pain. ER notes reviewed 3 weeks ago. Atypical pain relief with H2 blockers. R/O ECG normal. Had PE at end of March after hysterectomy done at [facility]. Needs to be on coumadin until September. Getting iv check at [facility] clinic which is not ideal and expensive. INR been ok per patient. No pain last two weeks. Pain nonexertional. NO associated dyspnea pleurisy or syncope. No LE edema. Compliant with meds. No previous CAD F/U stress echo was abnormal with failure to increase EF ? Anteroseptal RWMA and positive ECG at peak exercise.  Discussed options of cardiac CT or cath with patient and she prefers definitive test since one noninvasive test was already equivicol or not normal  ROS: Denies fever, malais, weight loss, blurry vision, decreased visual acuity, cough, sputum, SOB, hemoptysis, pleuritic pain, palpitaitons, heartburn, abdominal pain, melena, lower extremity edema, claudication, or rash.  All other systems reviewed and negative  General: Affect appropriate Anxious thin black female HEENT: normal Neck supple with no adenopathy JVP normal no bruits no thyromegaly Lungs clear with no wheezing and good diaphragmatic motion Heart:  S1/S2 no murmur, no rub, gallop or click PMI normal Abdomen: benighn, BS positve, no tenderness, no AAA no bruit.  No HSM or HJR Distal pulses intact with no bruits No edema Neuro non-focal Skin warm and dry No muscular weakness   Current Outpatient Prescriptions  Medication Sig Dispense Refill  . levothyroxine (SYNTHROID, LEVOTHROID) 75 MCG tablet Take 75 mcg by mouth daily.      Marland Kitchen warfarin (COUMADIN) 5 MG tablet Take 5 mg by mouth as directed.        Allergies  Shellfish allergy  Electrocardiogram:  06/16/11 SR rate 96 otherwise normal  Assessment and Plan

## 2011-08-04 NOTE — Assessment & Plan Note (Signed)
Continue replacement TSH q 6 months per primary

## 2011-08-04 NOTE — Assessment & Plan Note (Signed)
Over 3 months since PE dont think lovenox bridge needed   Hold coumadin prior to cath and resume post for additional 3 months

## 2011-08-04 NOTE — Patient Instructions (Addendum)
Your physician recommends that you return for lab work in: 08-12-12  BMET CBC INR  NEEDS INR CHECKED  ON AT OFFICE MON AM Your physician has requested that you have a cardiac catheterization. Cardiac catheterization is used to diagnose and/or treat various heart conditions. Doctors may recommend this procedure for a number of different reasons. The most common reason is to evaluate chest pain. Chest pain can be a symptom of coronary artery disease (CAD), and cardiac catheterization can show whether plaque is narrowing or blocking your heart's arteries. This procedure is also used to evaluate the valves, as well as measure the blood flow and oxygen levels in different parts of your heart. For further information please visit [email or web address] Please follow instruction sheet, as given. 7.15.13

## 2011-08-09 ENCOUNTER — Other Ambulatory Visit: Payer: Self-pay | Admitting: Cardiovascular Disease

## 2011-08-13 ENCOUNTER — Other Ambulatory Visit (INDEPENDENT_AMBULATORY_CARE_PROVIDER_SITE_OTHER): Payer: Self-pay

## 2011-08-13 DIAGNOSIS — R079 Chest pain, unspecified: Secondary | ICD-10-CM

## 2011-08-13 DIAGNOSIS — Z01810 Encounter for preprocedural cardiovascular examination: Secondary | ICD-10-CM

## 2011-08-13 LAB — CBC WITH DIFFERENTIAL/PLATELET
Basophils Absolute: 0 10*3/uL (ref 0.0–0.1)
Eosinophils Absolute: 0.1 10*3/uL (ref 0.0–0.7)
Hemoglobin: 11.8 g/dL — ABNORMAL LOW (ref 12.0–15.0)
Lymphocytes Relative: 45.5 % (ref 12.0–46.0)
MCHC: 32.5 g/dL (ref 30.0–36.0)
Neutro Abs: 2.1 10*3/uL (ref 1.4–7.7)
Neutrophils Relative %: 42.2 % — ABNORMAL LOW (ref 43.0–77.0)
Platelets: 338 10*3/uL (ref 150.0–400.0)
RDW: 14.1 % (ref 11.5–14.6)

## 2011-08-13 LAB — BASIC METABOLIC PANEL
BUN: 7 mg/dL (ref 6–23)
CO2: 30 mEq/L (ref 19–32)
Calcium: 10.4 mg/dL (ref 8.4–10.5)
Creatinine, Ser: 0.6 mg/dL (ref 0.4–1.2)
Glucose, Bld: 106 mg/dL — ABNORMAL HIGH (ref 70–99)

## 2011-08-16 ENCOUNTER — Telehealth: Payer: Self-pay | Admitting: Cardiovascular Disease

## 2011-08-16 ENCOUNTER — Ambulatory Visit (INDEPENDENT_AMBULATORY_CARE_PROVIDER_SITE_OTHER): Payer: Self-pay | Admitting: *Deleted

## 2011-08-16 DIAGNOSIS — I2699 Other pulmonary embolism without acute cor pulmonale: Secondary | ICD-10-CM

## 2011-08-23 ENCOUNTER — Ambulatory Visit (INDEPENDENT_AMBULATORY_CARE_PROVIDER_SITE_OTHER): Payer: Self-pay | Admitting: *Deleted

## 2011-08-23 ENCOUNTER — Inpatient Hospital Stay (HOSPITAL_BASED_OUTPATIENT_CLINIC_OR_DEPARTMENT_OTHER)
Admission: RE | Admit: 2011-08-23 | Discharge: 2011-08-23 | Disposition: A | Discharge status: Hospice | Payer: Self-pay | Source: Ambulatory Visit | Attending: Cardiovascular Disease | Admitting: Cardiovascular Disease

## 2011-08-23 ENCOUNTER — Encounter (HOSPITAL_BASED_OUTPATIENT_CLINIC_OR_DEPARTMENT_OTHER)
Admission: RE | Disposition: A | Discharge status: Hospice | Payer: Self-pay | Source: Ambulatory Visit | Attending: Cardiovascular Disease

## 2011-08-23 DIAGNOSIS — Z86711 Personal history of pulmonary embolism: Secondary | ICD-10-CM | POA: Insufficient documentation

## 2011-08-23 DIAGNOSIS — R079 Chest pain, unspecified: Secondary | ICD-10-CM | POA: Insufficient documentation

## 2011-08-23 DIAGNOSIS — Z7901 Long term (current) use of anticoagulants: Secondary | ICD-10-CM | POA: Insufficient documentation

## 2011-08-23 DIAGNOSIS — R9439 Abnormal result of other cardiovascular function study: Secondary | ICD-10-CM

## 2011-08-23 DIAGNOSIS — I2699 Other pulmonary embolism without acute cor pulmonale: Secondary | ICD-10-CM

## 2011-08-23 SURGERY — JV LEFT HEART CATHETERIZATION WITH CORONARY ANGIOGRAM
Anesthesia: Moderate Sedation

## 2011-08-23 MED ORDER — ACETAMINOPHEN 325 MG PO TABS: 650.0000 mg | ORAL_TABLET | ORAL | Status: DC | PRN

## 2011-08-23 MED ORDER — SODIUM CHLORIDE 0.9 % IV SOLN: 250.0000 mL | INTRAVENOUS | Status: DC

## 2011-08-23 MED ORDER — SODIUM CHLORIDE 0.45 % IV SOLN: INTRAVENOUS | Status: AC

## 2011-08-23 MED ORDER — ASPIRIN 81 MG PO CHEW
324.0000 mg | CHEWABLE_TABLET | ORAL | Status: AC
  Administered 2011-08-23: 324 mg via ORAL

## 2011-08-23 MED ORDER — METOPROLOL TARTRATE 25 MG PO TABS
25.0000 mg | ORAL_TABLET | Freq: Once | ORAL | Status: AC
  Administered 2011-08-23: 25 mg via ORAL
  Filled 2011-08-23: qty 1

## 2011-08-23 MED ORDER — SODIUM CHLORIDE 0.9 % IJ SOLN: 3.0000 mL | INTRAMUSCULAR | Status: DC | PRN

## 2011-08-23 MED ORDER — SODIUM CHLORIDE 0.9 % IV SOLN: 1.0000 mL/kg/h | INTRAVENOUS | Status: DC

## 2011-08-23 MED ORDER — SODIUM CHLORIDE 0.9 % IJ SOLN: 3.0000 mL | Freq: Two times a day (BID) | INTRAMUSCULAR | Status: DC

## 2011-08-23 MED ORDER — SODIUM CHLORIDE 0.9 % IV SOLN: 250.0000 mL | INTRAVENOUS | Status: DC | PRN

## 2011-08-23 MED ORDER — OXYCODONE-ACETAMINOPHEN 5-325 MG PO TABS: 1.0000 | ORAL_TABLET | ORAL | Status: DC | PRN

## 2011-08-23 MED ORDER — ONDANSETRON HCL 4 MG/2ML IJ SOLN: 4.0000 mg | Freq: Four times a day (QID) | INTRAMUSCULAR | Status: DC | PRN

## 2011-08-23 NOTE — H&P (View-Only) (Signed)
Patient ID: [name], female   DOB: 01/06/1956, 55 y.o.   MRN: [id number] 55 yo referred by ER /Molphos for chest pain. ER notes reviewed 3 weeks ago. Atypical pain relief with H2 blockers. R/O ECG normal. Had PE at end of March after hysterectomy done at [facility]. Needs to be on coumadin until September. Getting iv check at [facility] which is not ideal and expensive. INR been ok per patient. No pain last two weeks. Pain nonexertional. NO associated dyspnea pleurisy or syncope. No LE edema. Compliant with meds. No previous CAD F/U stress echo was abnormal with failure to increase EF ? Anteroseptal RWMA and positive ECG at peak exercise.  Discussed options of cardiac CT or cath with patient and she prefers definitive test since one noninvasive test was already equivicol or not normal  ROS: Denies fever, malais, weight loss, blurry vision, decreased visual acuity, cough, sputum, SOB, hemoptysis, pleuritic pain, palpitaitons, heartburn, abdominal pain, melena, lower extremity edema, claudication, or rash.  All other systems reviewed and negative  General: Affect appropriate Anxious thin black female HEENT: normal Neck supple with no adenopathy JVP normal no bruits no thyromegaly Lungs clear with no wheezing and good diaphragmatic motion Heart:  S1/S2 no murmur, no rub, gallop or click PMI normal Abdomen: benighn, BS positve, no tenderness, no AAA no bruit.  No HSM or HJR Distal pulses intact with no bruits No edema Neuro non-focal Skin warm and dry No muscular weakness   Current Outpatient Prescriptions  Medication Sig Dispense Refill  . levothyroxine (SYNTHROID, LEVOTHROID) 75 MCG tablet Take 75 mcg by mouth daily.      . warfarin (COUMADIN) 5 MG tablet Take 5 mg by mouth as directed.        Allergies  Shellfish allergy  Electrocardiogram:  06/16/11 SR rate 96 otherwise normal  Assessment and Plan  

## 2011-08-23 NOTE — OR Nursing (Signed)
Meal served 

## 2011-08-23 NOTE — OR Nursing (Signed)
Tegaderm dressing applied, site level 0, bedrest begins at 1000 

## 2011-08-23 NOTE — Interval H&P Note (Signed)
History and Physical Interval Note:  08/23/2011 9:17 AM  [name]  has presented today for surgery, with the diagnosis of Abnormal Stress  The various methods of treatment have been discussed with the patient and family. After consideration of risks, benefits and other options for treatment, the patient has consented to  Procedure(s) (LRB): JV LEFT HEART CATHETERIZATION WITH CORONARY ANGIOGRAM (N/A) as a surgical intervention .  The patient's history has been reviewed, patient examined, no change in status, stable for surgery.  I have reviewed the patients' chart and labs.  Questions were answered to the patient's satisfaction.     Charlton Haws

## 2011-08-23 NOTE — CV Procedure (Signed)
      Catheterization   Indication: Chest Pain abnormal stress echo  Procedure: After informed consent and clinical "time out" the right groin was prepped and draped in a sterile fashion.  A 5Fr sheath was placed in the right femoral artery using seldinger technique and local lidocaine.  Standard JL4, JR4 and angled pigtail catheters were used to engage the coronary arteries.  Coronary arteries were visualized in orthogonal views using caudal and cranial angulation.  RAO ventriculography was done using 25 * cc of contrast.    Medications:   Versed: 2 mg's  Fentanyl: 0 ug's  Coronary Arteries: Right dominant with no anomalies  LM:  Normal long segment artery  LAD: normal    D1: normal  D2 normal  D3: normal  Circumflex: diminutive with normal AV groove   OM1: normal    RCA: dominant and normal   PDA: normal  PLA: normal  Ventriculography: EF: 65 %, no RWMA;s Aortogram:  Done to R/O anomalous circumflex.  No "DOT" sign on RAO venticulogram and no anomalous coronary seen on LAO aortogram  Hemodynamics:  Aortic Pressure: 148 84 mmHg  LV Pressure: 147 16 20  mmHg  Impression:  No significant CAD.  Resume coumadin for PE that she had in March.    Charlton Haws 08/23/2011 9:19 AM

## 2011-08-30 ENCOUNTER — Ambulatory Visit (INDEPENDENT_AMBULATORY_CARE_PROVIDER_SITE_OTHER): Payer: Self-pay

## 2011-08-30 DIAGNOSIS — I2699 Other pulmonary embolism without acute cor pulmonale: Secondary | ICD-10-CM

## 2011-08-30 LAB — POCT INR: INR: 1.9

## 2011-09-13 ENCOUNTER — Ambulatory Visit (INDEPENDENT_AMBULATORY_CARE_PROVIDER_SITE_OTHER): Payer: Self-pay | Admitting: *Deleted

## 2011-09-13 DIAGNOSIS — I2699 Other pulmonary embolism without acute cor pulmonale: Secondary | ICD-10-CM

## 2011-09-13 LAB — POCT INR: INR: 2.8

## 2011-09-14 ENCOUNTER — Encounter: Payer: Self-pay | Admitting: Cardiovascular Disease

## 2011-09-14 ENCOUNTER — Ambulatory Visit (INDEPENDENT_AMBULATORY_CARE_PROVIDER_SITE_OTHER): Payer: Self-pay | Admitting: Cardiovascular Disease

## 2011-09-14 VITALS — BP 122/78 | HR 94 | Ht 66.0 in | Wt 139.0 lb

## 2011-09-14 DIAGNOSIS — R079 Chest pain, unspecified: Secondary | ICD-10-CM

## 2011-09-14 DIAGNOSIS — E079 Disorder of thyroid, unspecified: Secondary | ICD-10-CM

## 2011-09-14 DIAGNOSIS — I2699 Other pulmonary embolism without acute cor pulmonale: Secondary | ICD-10-CM

## 2011-09-14 NOTE — Assessment & Plan Note (Signed)
F/U coumadin clinic 9/3  D/C coumadin end of September.  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

## 2011-09-14 NOTE — Patient Instructions (Signed)
Your physician recommends that you schedule a follow-up appointment in: AS NEEDED Your physician has recommended you make the following change in your medication:STOP COUMADIN  IN September  ONLY NEEDS FOLLOW UP IN COUMADIN CLINIC 1 MORE TIME

## 2011-09-14 NOTE — Assessment & Plan Note (Signed)
Atypical normal cath Noncardiac

## 2011-09-14 NOTE — Progress Notes (Signed)
Patient ID: [name], female   DOB: November 15, 1955, 56 y.o.   MRN: [id number] 56 yo referred by ER Jeannine Boga for chest pain in June . ER notes reviewed . Atypical pain relief with H2 blockers. R/O ECG normal. Had PE at end of March after hysterectomy done at [facility]. Needs to be on coumadin until September. Getting iv check at [facility] clinic which is not ideal and expensive. INR been ok per patient. No pain last two weeks. Pain nonexertional. NO associated dyspnea pleurisy or syncope. No LE edema. Compliant with meds. No previous CAD F/U stress echo was abnormal with failure to increase EF ? Anteroseptal RWMA and positive ECG at peak exercise.  Subsequently had heart cath 7/15 witch was normal with no CAD.    ROS: Denies fever, malais, weight loss, blurry vision, decreased visual acuity, cough, sputum, SOB, hemoptysis, pleuritic pain, palpitaitons, heartburn, abdominal pain, melena, lower extremity edema, claudication, or rash.  All other systems reviewed and negative  General: Affect appropriate Healthy:  appears stated age HEENT: normal Neck supple with no adenopathy JVP normal no bruits no thyromegaly Lungs clear with no wheezing and good diaphragmatic motion Heart:  S1/S2 no murmur, no rub, gallop or click PMI normal Abdomen: benighn, BS positve, no tenderness, no AAA no bruit.  No HSM or HJR Distal pulses intact with no bruits No edema Neuro non-focal Skin warm and dry No muscular weakness   Current Outpatient Prescriptions  Medication Sig Dispense Refill  . levothyroxine (SYNTHROID, LEVOTHROID) 75 MCG tablet Take 75 mcg by mouth daily.      . metoprolol tartrate (LOPRESSOR) 25 MG tablet Take 25 mg by mouth 2 (two) times daily.      Marland Kitchen warfarin (COUMADIN) 5 MG tablet Take 5 mg by mouth as directed.        Allergies  Shellfish allergy  Electrocardiogram:  Assessment and Plan

## 2011-09-14 NOTE — Assessment & Plan Note (Signed)
F/U primary  TSH q 6 months

## 2011-10-12 ENCOUNTER — Ambulatory Visit (INDEPENDENT_AMBULATORY_CARE_PROVIDER_SITE_OTHER): Payer: Self-pay | Admitting: *Deleted

## 2011-10-12 DIAGNOSIS — I2699 Other pulmonary embolism without acute cor pulmonale: Secondary | ICD-10-CM

## 2011-10-27 ENCOUNTER — Ambulatory Visit (INDEPENDENT_AMBULATORY_CARE_PROVIDER_SITE_OTHER): Payer: Self-pay | Admitting: *Deleted

## 2011-10-27 ENCOUNTER — Ambulatory Visit: Payer: Self-pay | Admitting: Cardiovascular Disease

## 2011-10-27 DIAGNOSIS — I2699 Other pulmonary embolism without acute cor pulmonale: Secondary | ICD-10-CM

## 2011-10-27 LAB — POCT INR: INR: 3.6

## 2011-11-10 ENCOUNTER — Ambulatory Visit (INDEPENDENT_AMBULATORY_CARE_PROVIDER_SITE_OTHER): Payer: Self-pay | Admitting: *Deleted

## 2011-11-10 DIAGNOSIS — I2699 Other pulmonary embolism without acute cor pulmonale: Secondary | ICD-10-CM

## 2011-11-10 LAB — POCT INR: INR: 2.4

## 2011-12-01 ENCOUNTER — Ambulatory Visit (INDEPENDENT_AMBULATORY_CARE_PROVIDER_SITE_OTHER): Payer: Self-pay | Admitting: *Deleted

## 2011-12-01 DIAGNOSIS — I2699 Other pulmonary embolism without acute cor pulmonale: Secondary | ICD-10-CM

## 2011-12-29 ENCOUNTER — Ambulatory Visit (INDEPENDENT_AMBULATORY_CARE_PROVIDER_SITE_OTHER): Payer: Self-pay | Admitting: Pharmacist

## 2011-12-29 DIAGNOSIS — R0989 Other specified symptoms and signs involving the circulatory and respiratory systems: Secondary | ICD-10-CM

## 2011-12-29 DIAGNOSIS — I2699 Other pulmonary embolism without acute cor pulmonale: Secondary | ICD-10-CM

## 2012-08-10 ENCOUNTER — Telehealth: Payer: Self-pay | Admitting: Cardiovascular Disease

## 2012-08-10 NOTE — Telephone Encounter (Signed)
New Problem:    Patient called in wanting to know if she still needed to take her metoprolol tartrate (LOPRESSOR) 25 MG tablet.  Please call back.

## 2012-08-10 NOTE — Telephone Encounter (Signed)
Left message for pt to call.

## 2012-08-15 MED ORDER — METOPROLOL TARTRATE 25 MG PO TABS: 25.0000 mg | ORAL_TABLET | Freq: Two times a day (BID) | ORAL | Status: DC

## 2012-08-15 NOTE — Telephone Encounter (Signed)
Returned call to patient no answer.LMTC. 

## 2012-08-15 NOTE — Telephone Encounter (Signed)
Follow Up: ° ° ° ° °Pt returning your call. °

## 2012-08-15 NOTE — Telephone Encounter (Signed)
Patient called she stated she needed a refill on metoprolol.Refill sent to pharmacy.

## 2012-09-09 IMAGING — CR DG CHEST 2V
2 series · 2 of 2 positions shown · non-contrast
Comparison: 05/09/2011; 04/30/2007; chest CT - 05/09/2011

CLINICAL DATA: Gastroesophageal reflux

CHEST - 2 VIEW

[w chest pa]
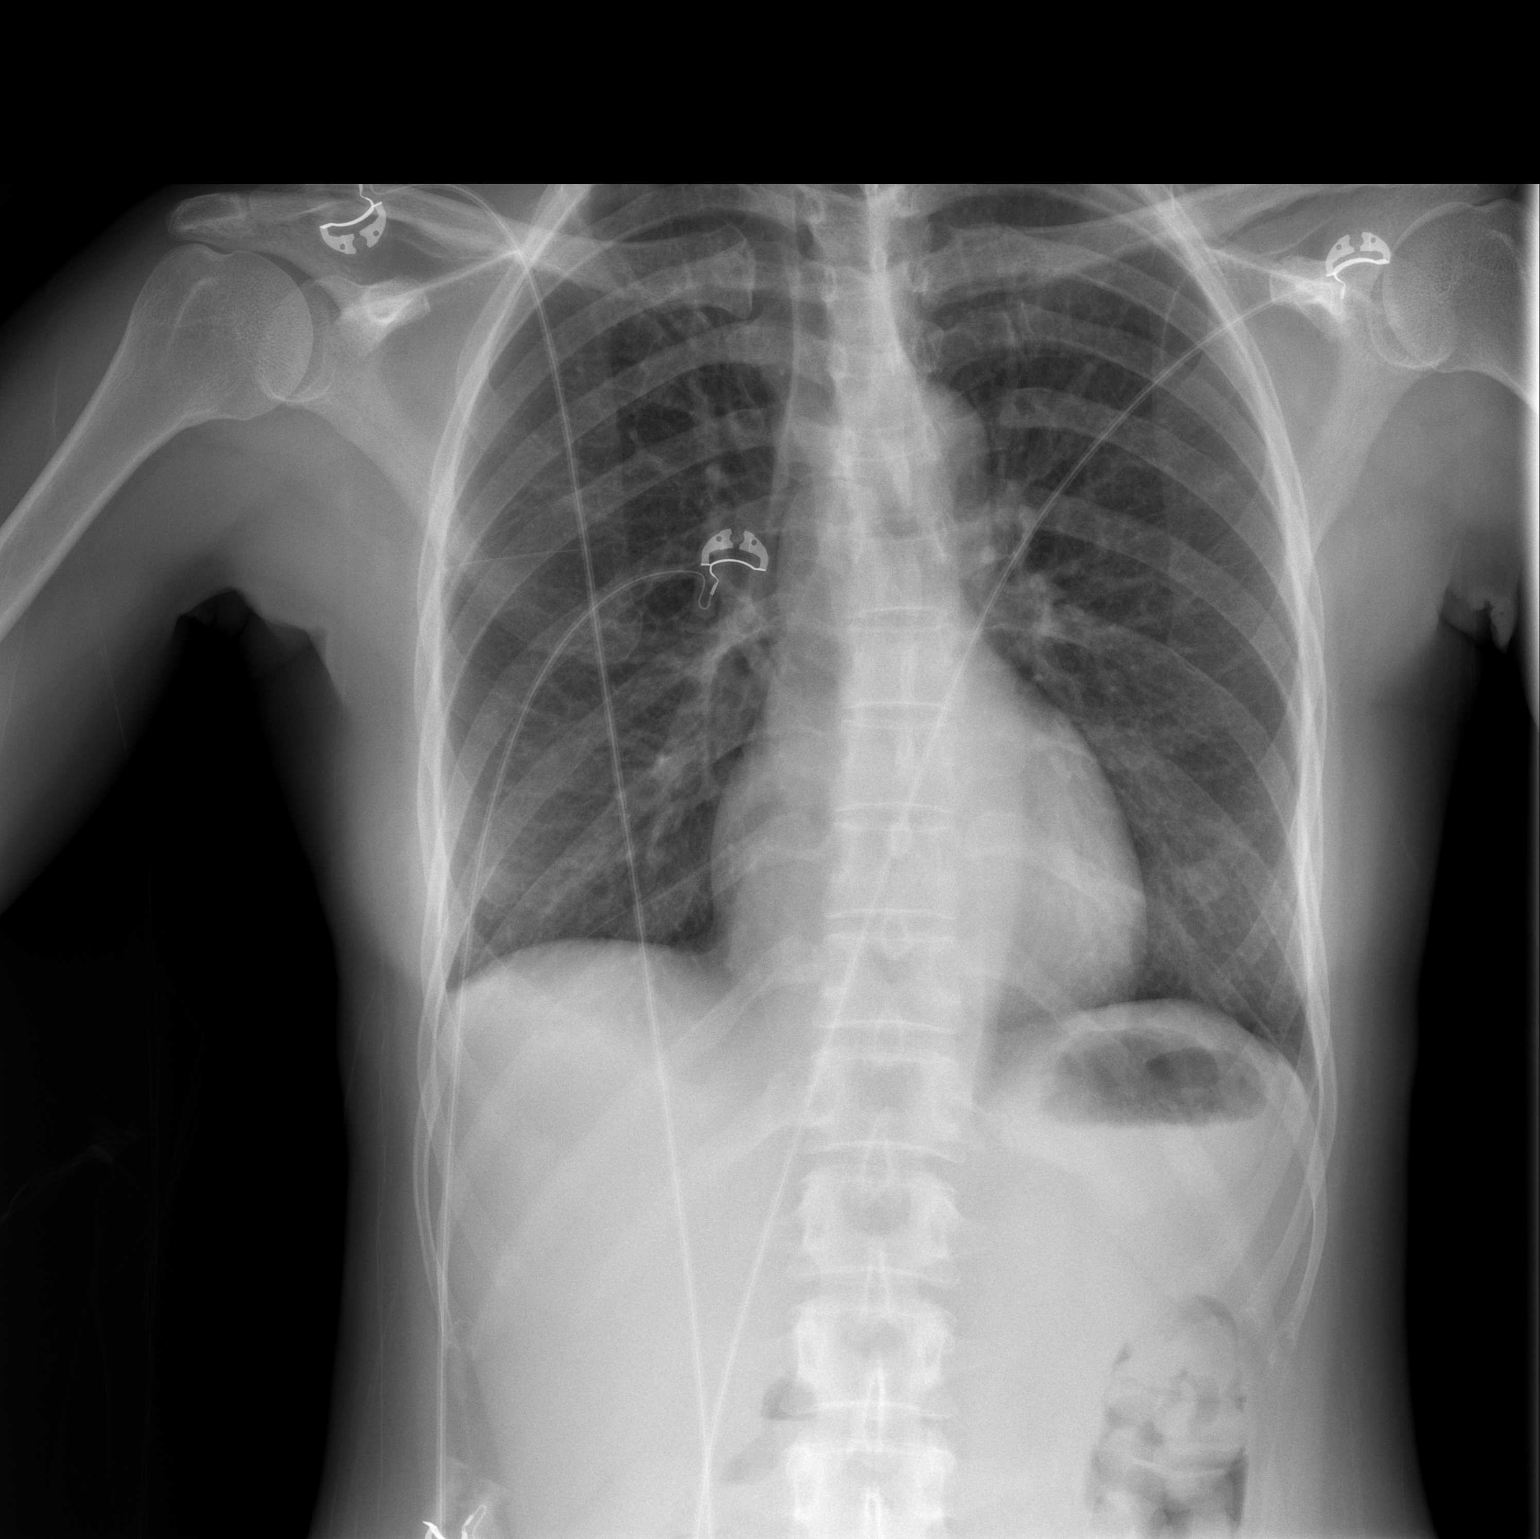

[w chest lat]
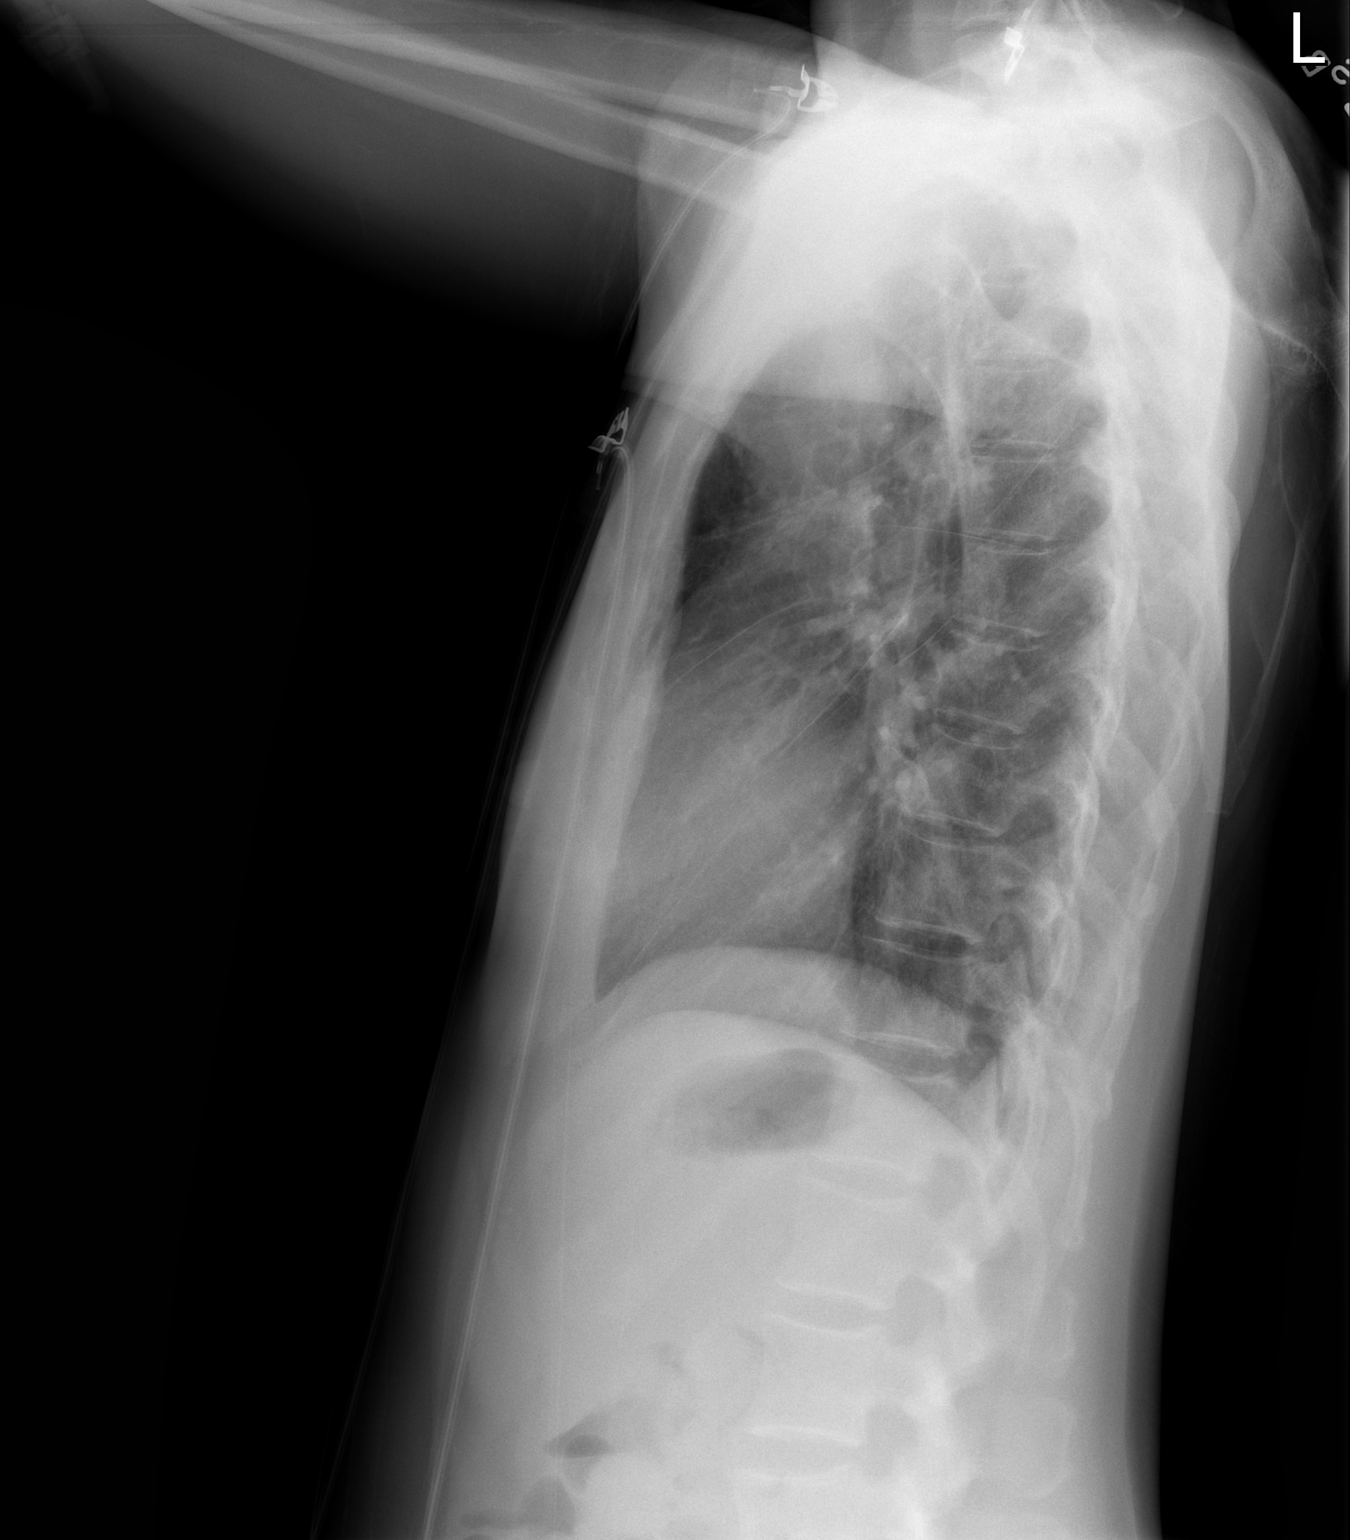

[2 of 2 positions shown; findings below may reference images not displayed]

FINDINGS: Unchanged cardiac silhouette and mediastinal contours.
Interval resolution of previously noted small right-sided pleural
effusion and bibasilar heterogeneous opacities.  No focal airspace
opacities.  No pneumothorax.  Unchanged bones.
IMPRESSION: 1.  No acute cardiopulmonary disease.
2.  Interval resolution of right-sided effusion and bibasilar
opacities.

## 2013-11-23 NOTE — Telephone Encounter (Signed)
Nothing was typed/tmj 

## 2014-02-14 ENCOUNTER — Encounter (HOSPITAL_BASED_OUTPATIENT_CLINIC_OR_DEPARTMENT_OTHER): Payer: Self-pay | Admitting: *Deleted

## 2014-02-14 ENCOUNTER — Emergency Department (HOSPITAL_BASED_OUTPATIENT_CLINIC_OR_DEPARTMENT_OTHER): Payer: Self-pay

## 2014-02-14 ENCOUNTER — Emergency Department (HOSPITAL_BASED_OUTPATIENT_CLINIC_OR_DEPARTMENT_OTHER)
Admission: EM | Admit: 2014-02-14 | Discharge: 2014-02-14 | Disposition: A | Discharge status: Home / Self Care | Payer: Self-pay | Attending: Emergency Medicine | Admitting: Emergency Medicine

## 2014-02-14 DIAGNOSIS — E079 Disorder of thyroid, unspecified: Secondary | ICD-10-CM | POA: Insufficient documentation

## 2014-02-14 DIAGNOSIS — Z7901 Long term (current) use of anticoagulants: Secondary | ICD-10-CM | POA: Insufficient documentation

## 2014-02-14 DIAGNOSIS — R0781 Pleurodynia: Secondary | ICD-10-CM | POA: Insufficient documentation

## 2014-02-14 DIAGNOSIS — Z90710 Acquired absence of both cervix and uterus: Secondary | ICD-10-CM | POA: Insufficient documentation

## 2014-02-14 DIAGNOSIS — Z79899 Other long term (current) drug therapy: Secondary | ICD-10-CM | POA: Insufficient documentation

## 2014-02-14 DIAGNOSIS — Z86711 Personal history of pulmonary embolism: Secondary | ICD-10-CM | POA: Insufficient documentation

## 2014-02-14 DIAGNOSIS — I10 Essential (primary) hypertension: Secondary | ICD-10-CM | POA: Insufficient documentation

## 2014-02-14 HISTORY — DX: Essential (primary) hypertension: I10

## 2014-02-14 LAB — COMPREHENSIVE METABOLIC PANEL
ALT: 12 U/L (ref 0–35)
AST: 17 U/L (ref 0–37)
Albumin: 4.2 g/dL (ref 3.5–5.2)
Alkaline Phosphatase: 61 U/L (ref 39–117)
Anion gap: 8 (ref 5–15)
BUN: 8 mg/dL (ref 6–23)
CALCIUM: 9.2 mg/dL (ref 8.4–10.5)
CO2: 29 mmol/L (ref 19–32)
Chloride: 104 mEq/L (ref 96–112)
Creatinine, Ser: 0.65 mg/dL (ref 0.50–1.10)
GFR calc Af Amer: >90 mL/min (ref >90)
GFR calc non Af Amer: >90 mL/min (ref >90)
GLUCOSE: 102 mg/dL — AB (ref 70–99)
Potassium: 4.3 mmol/L (ref 3.5–5.1)
SODIUM: 141 mmol/L (ref 135–145)
TOTAL PROTEIN: 7.8 g/dL (ref 6.0–8.3)
Total Bilirubin: 0.4 mg/dL (ref 0.3–1.2)

## 2014-02-14 LAB — PROTIME-INR
INR: 1 (ref 0.00–1.49)
Prothrombin Time: 13.2 seconds (ref 11.6–15.2)

## 2014-02-14 LAB — CBC
HEMATOCRIT: 38.9 % (ref 36.0–46.0)
HEMOGLOBIN: 12.9 g/dL (ref 12.0–15.0)
MCH: 24.5 pg — ABNORMAL LOW (ref 26.0–34.0)
MCHC: 33.2 g/dL (ref 30.0–36.0)
MCV: 73.8 fL — ABNORMAL LOW (ref 78.0–100.0)
Platelets: 318 10*3/uL (ref 150–400)
RBC: 5.27 MIL/uL — ABNORMAL HIGH (ref 3.87–5.11)
RDW: 14.6 % (ref 11.5–15.5)
WBC: 5.9 10*3/uL (ref 4.0–10.5)

## 2014-02-14 LAB — APTT: APTT: 25 s (ref 24–37)

## 2014-02-14 LAB — TROPONIN I: Troponin I: <0.03 ng/mL (ref <0.031)

## 2014-02-14 MED ORDER — IOHEXOL 350 MG/ML SOLN
100.0000 mL | Freq: Once | INTRAVENOUS | Status: AC | PRN
  Administered 2014-02-14: 100 mL via INTRAVENOUS

## 2014-02-14 MED ORDER — IBUPROFEN 600 MG PO TABS: 600.0000 mg | ORAL_TABLET | Freq: Four times a day (QID) | ORAL | Status: DC | PRN

## 2014-02-14 NOTE — Discharge Instructions (Signed)
Chest Pain (Nonspecific) °It is often hard to give a specific diagnosis for the cause of chest pain. There is always a chance that your pain could be related to something serious, such as a heart attack or a blood clot in the lungs. You need to follow up with your health care provider for further evaluation. °CAUSES  °· Heartburn. °· Pneumonia or bronchitis. °· Anxiety or stress. °· Inflammation around your heart (pericarditis) or lung (pleuritis or pleurisy). °· A blood clot in the lung. °· A collapsed lung (pneumothorax). It can develop suddenly on its own (spontaneous pneumothorax) or from trauma to the chest. °· Shingles infection (herpes zoster virus). °The chest wall is composed of bones, muscles, and cartilage. Any of these can be the source of the pain. °· The bones can be bruised by injury. °· The muscles or cartilage can be strained by coughing or overwork. °· The cartilage can be affected by inflammation and become sore (costochondritis). °DIAGNOSIS  °Lab tests or other studies may be needed to find the cause of your pain. Your health care provider may have you take a test called an ambulatory electrocardiogram (ECG). An ECG records your heartbeat patterns over a 24-hour period. You may also have other tests, such as: °· Transthoracic echocardiogram (TTE). During echocardiography, sound waves are used to evaluate how blood flows through your heart. °· Transesophageal echocardiogram (TEE). °· Cardiac monitoring. This allows your health care provider to monitor your heart rate and rhythm in real time. °· Holter monitor. This is a portable device that records your heartbeat and can help diagnose heart arrhythmias. It allows your health care provider to track your heart activity for several days, if needed. °· Stress tests by exercise or by giving medicine that makes the heart beat faster. °TREATMENT  °· Treatment depends on what may be causing your chest pain. Treatment may include: °· Acid blockers for  heartburn. °· Anti-inflammatory medicine. °· Pain medicine for inflammatory conditions. °· Antibiotics if an infection is present. °· You may be advised to change lifestyle habits. This includes stopping smoking and avoiding alcohol, caffeine, and chocolate. °· You may be advised to keep your head raised (elevated) when sleeping. This reduces the chance of acid going backward from your stomach into your esophagus. °Most of the time, nonspecific chest pain will improve within 2-3 days with rest and mild pain medicine.  °HOME CARE INSTRUCTIONS  °· If antibiotics were prescribed, take them as directed. Finish them even if you start to feel better. °· For the next few days, avoid physical activities that bring on chest pain. Continue physical activities as directed. °· Do not use any tobacco products, including cigarettes, chewing tobacco, or electronic cigarettes. °· Avoid drinking alcohol. °· Only take medicine as directed by your health care provider. °· Follow your health care provider's suggestions for further testing if your chest pain does not go away. °· Keep any follow-up appointments you made. If you do not go to an appointment, you could develop lasting (chronic) problems with pain. If there is any problem keeping an appointment, call to reschedule. °SEEK MEDICAL CARE IF:  °· Your chest pain does not go away, even after treatment. °· You have a rash with blisters on your chest. °· You have a fever. °SEEK IMMEDIATE MEDICAL CARE IF:  °· You have increased chest pain or pain that spreads to your arm, neck, jaw, back, or abdomen. °· You have shortness of breath. °· You have an increasing cough, or you cough   up blood.  You have severe back or abdominal pain.  You feel nauseous or vomit.  You have severe weakness.  You faint.  You have chills. This is an emergency. Do not wait to see if the pain will go away. Get medical help at once. Call your local emergency services (911 in [place] Do not drive  yourself to the hospital. MAKE SURE YOU:   Understand these instructions.  Will watch your condition.  Will get help right away if you are not doing well or get worse. Document Released: 11/04/2004 Document Revised: 01/30/2013 Document Reviewed: 08/31/2007 [facility] Patient Information 2015 [place], [place]. This information is not intended to replace advice given to you by your health care provider. Make sure you discuss any questions you have with your health care provider. Pleurisy Pleurisy is an inflammation and swelling of the lining of the lungs (pleura). Because of this inflammation, it hurts to breathe. It can be aggravated by coughing, laughing, or deep breathing. Pleurisy is often caused by an underlying infection or disease.  HOME CARE INSTRUCTIONS  Monitor your pleurisy for any changes. The following actions may help to alleviate any discomfort you are experiencing:  Medicine may help with pain. Only take over-the-counter or prescription medicines for pain, discomfort, or fever as directed by your health care provider.  Only take antibiotic medicine as directed. Make sure to finish it even if you start to feel better. SEEK MEDICAL CARE IF:   Your pain is not controlled with medicine or is increasing.  You have an increase in pus-like (purulent) secretions brought up with coughing. SEEK IMMEDIATE MEDICAL CARE IF:   You have blue or dark lips, fingernails, or toenails.  You are coughing up blood.  You have increased difficulty breathing.  You have continuing pain unrelieved by medicine or pain lasting more than 1 week.  You have pain that radiates into your neck, arms, or jaw.  You develop increased shortness of breath or wheezing.  You develop a fever, rash, vomiting, fainting, or other serious symptoms. MAKE SURE YOU:  Understand these instructions.   Will watch your condition.   Will get help right away if you are not doing well or get worse.  Document  Released: 01/25/2005 Document Revised: 09/27/2012 Document Reviewed: 07/09/2012 [facility] Patient Information 2015 [place], [place]. This information is not intended to replace advice given to you by your health care provider. Make sure you discuss any questions you have with your health care provider.  Emergency Department Resource Guide 1) Find a Doctor and Pay Out of Pocket Although you won't have to find out who is covered by your insurance plan, it is a good idea to ask around and get recommendations. You will then need to call the office and see if the doctor you have chosen will accept you as a new patient and what types of options they offer for patients who are self-pay. Some doctors offer discounts or will set up payment plans for their patients who do not have insurance, but you will need to ask so you aren't surprised when you get to your appointment.  2) Contact Your Local Health Department Not all health departments have doctors that can see patients for sick visits, but many do, so it is worth a call to see if yours does. If you don't know where your local health department is, you can check in your phone book. The CDC also has a tool to help you locate your state's health department, and many state  websites also have listings of all of their local health departments.  3) Find a Walk-in Clinic If your illness is not likely to be very severe or complicated, you may want to try a walk in clinic. These are popping up all over the country in pharmacies, drugstores, and shopping centers. They're usually staffed by nurse practitioners or physician assistants that have been trained to treat common illnesses and complaints. They're usually fairly quick and inexpensive. However, if you have serious medical issues or chronic medical problems, these are probably not your best option.  No Primary Care Doctor: - Call Health Connect at  [phone] - they can help you locate a primary care doctor that   accepts your insurance, provides certain services, etc. - Physician Referral Service- [phone]  Chronic Pain Problems: Organization         Address  Phone   Notes  [facility]  [phone] Patients need to be referred by their primary care doctor.   Medication Assistance: Organization         Address  Phone   Notes  [facility] [address] [place] [place], [place] [address] [phone] --Must be a resident of [facility] -- Must have NO insurance coverage whatsoever (no Medicaid/ Medicare, etc.) -- The pt. MUST have a primary care doctor that directs their care regularly and follows them in the community   [facility]ist  [phone]   [employer]  [phone]    Agencies that provide inexpensive medical care: Organization         Address  Phone   Notes  [facility]  [phone]   [facility]    [phone]   [facility] [address] [place], [place] [address] [phone]   [facility] [address] [place], [place] [phone]   Planned Parenthood    [phone]   [facility]    [phone]   [facility]  [address], [place] Phone:  [phone], Fax:  [phone] Hours of Operation:  9 am - 6 pm, M-F.  Also accepts Medicaid/Medicare and self-pay.  [facility]  [address], [place] Phone: [phone], Fax: [phone]. Hours of Operation:  8:30 am - 5:30 pm, M-F.  Also accepts Medicaid and self-pay.  [facility] [place] [address] [place] Phone: [phone]   [facility] [address] Natasha Bence [place], [place] [phone], Ext. [phone] Mondays & Thursdays: 7-9 AM.  First 15 patients are seen on a first come, first serve basis.    Medicaid-accepting [facility] Providers:  Organization          Address  Phone   Notes  [facility] [address], [place] [phone] Also accepts self-pay patients.  [facility] [address] Laurell Josephs [place], [place]  [phone]   [facility] [address], [place] [phone]   [facility] [address], [place] [phone]   Renaye Rakers [address], [place]   [phone] Only accepts [place] Access [place] patients after they have their name applied to their card.   Self-Pay (no insurance) in [place] [facility]:  [employer]  Phone   Notes  Sickle Cell Patients, [facility] [address] [place], [place] [phone]   [facility] [address] [place], [place] [phone]   [facility] [place]  [address], [place], [place] [phone]   [facility]/Dr. Osei-Bonsu  [address] [place] or [address], [place] [phone] Phone number for both [place] and [place] locations is the same.  [facility] [address] [place] [phone]   [facility] [address], [place] or [address] [phone]   [facility] [address] [place] [phone], phone; [phone], fax Sees patients 1st and 3rd Saturday of every month.  Must not qualify for public or private insurance (i.e. Medicaid, Medicare, [place] Health Choice, Veterans' Benefits)  Household income should be no more than 200% of the poverty level The clinic cannot treat you if you are pregnant or think you are pregnant  Sexually transmitted diseases are not treated at the clinic.    Dental Care: Organization         Address  Phone  Notes  [facility] [address] [place],  [place] [phone] Accepts children up to age 80 who are enrolled in [place] or [address] Health Choice; pregnant women with a Medicaid card; and children who have applied for Medicaid or [place] Health Choice, but were declined, whose parents can pay a reduced fee at time of service.  [facility]  [address], [place] [phone] Accepts children up to age 45 who are enrolled in [place] or [place] Health Choice; pregnant women with a Medicaid card; and children who have applied for Medicaid or [place] Health Choice, but were declined, whose parents can pay a reduced fee at time of service.  [facility]  [address] [place], [place] [phone] Patients are seen by appointment only. Walk-ins are not accepted. [facility] will see patients 84 years of age and older. Monday - Tuesday (8am-5pm) Most Wednesdays (8:30-5pm) $30 per visit, cash only  [facility] Adult [facility] Access [facility]  [address], [facility] [phone] Patients are seen by appointment only. Walk-ins are not accepted. [facility] will see patients 2 years of age and older. One Wednesday Evening (Monthly: Volunteer Based).  $30 per visit, cash only  [employer]  [phone] for adults; Children under age 85, call Graduate Pediatric Dentistry at [phone]. Children aged 13-14, please call [phone] to request a pediatric application.  Dental services are provided in all areas of dental care including fillings, crowns and bridges, complete and partial dentures, implants, gum treatment, root canals, and extractions. Preventive care is also provided. Treatment is provided to both adults and children. Patients are selected via a lottery and there is often a waiting list.   [facility] [address], [place]  [phone] [email or web address]   [facility] [address] [place], [place]  [phone], Ext. [phone] Second and Fourth Thursday of each month, opens at 6:30 AM; Clinic ends at 9 AM.  Patients are seen on a first-come first-served basis, and a limited number are seen during each clinic.   [facility]  [address], [place], [place] (  [phone]   Eligibility Requirements You must have lived in [place], [place], or [place] counties for at least the last three months.   You cannot be eligible for state or federal sponsored [place], including [employer], [place], or [employer].   You generally cannot be eligible for healthcare insurance through your employer.    How to apply: Eligibility screenings are held every Tuesday and Wednesday afternoon from 1:00 pm until 4:00 pm. You do not need an appointment for the interview!  [facility] [address], [place], [place] [phone]   [facility]  [phone]   [facility] Department  [phone]   [facility]  [phone]    Behavioral Health Resources in the Community: Intensive Outpatient Programs Organization         Address  Phone  Notes  [facility] [address], [place], [place] [phone]   [facility] [address] [place], [place] [phone]   [facility] [address] [place], [place]  [phone]   [facility] [address],  [place], [place] [phone] or [phone]   Substance Abuse Resources Organization         Address  Phone  Notes  [facility]  [phone]   [facility]  [phone]   [facility] [place]  [phone]   Floydene Flock  [phone]   Residential & Outpatient Substance Abuse Program  [phone]   Psychological Services Organization         Address  Phone  Notes  [facility] Behavioral Health  [phone]   [facility]  [phone]    [facility] [address] [place] [phone] or [phone]    Mobile Crisis Teams Organization         Address  Phone  Notes  [facility], Mobile Crisis Care Unit  [phone]   [facility]  [address] [place], [place] [phone]   Doristine Locks [address] [place] [phone]    Self-Help/Support Groups Organization         Address  Phone             Notes  [facility] [place] - variety of support groups  [phone]- I7437963 Call for more information  Narcotics Anonymous (NA), [facility] [address], [employer] [place]  2 meetings at this location   Statistician         Address  Phone  Notes  [facility] [address],    [place]  [phone]   [facility]  [address] [place] [address], [place], [place] [phone]   [facility] [address] [place], [place] [phone] Admissions: 8am-3pm M-F  [facility] [address],    [place], [place] [phone]   [facility] [address] Starling Manns [place], [place] [phone]   [facility] [address],  [place], [place] [phone]   [facility] - Intensive Outpatient [address] Laurell Josephs [address], [place], [place] [phone]   [facility] ([facility] [address]  [place], [place] [phone] or [phone]   [facility] ([facility]) [address], [place], [place] [phone] Accepts Medicaid  [facility] [address]  [place] [phone] Substance Abuse/Addiction Treatment   [facility] Resources Organization  Address  Phone  Notes  [facility]  [phone]) [phone]   Angie FavaJulie Brannon, PhD [address] [place], [place]   [phone]) [phone] or [phone]) [phone]   [facility]   [address] [place], [place] [phone]) [phone]   [facility] [address], [place], [place] [phone] Insurance/Medicaid/sponsorship through [facility]  [facility] [address]                                    [place], [place] [phone] Therapy/tele-psych/case  [facility] [address]   [place], [place] [phone]    Dr. Lolly MustacheArfeen  [phone]   [facility]  [employer] [facility] 1) [address], [place] 2) [address], [place] 3)  [address], [place] [phone]  [phone]   [facility] Child Abuse Hotline [phone]) [phone] or [phone] (After Hours)

## 2014-02-14 NOTE — ED Provider Notes (Signed)
CSN: [id number]     Arrival date & time 02/14/14  0845 History   First MD Initiated Contact with Patient 02/14/14 0957     Chief Complaint  Patient presents with  . Chest Pain     (Consider location/radiation/quality/duration/timing/severity/associated sxs/prior Treatment) HPI For the past week the patient has been getting intermittent sharp pain in her right posterior thoracic chest. It sometimes radiates to the front. She reports she sometimes feels a little short of breath and lightheaded when it happens. It comes and goes. She has times that she feels asymptomatic. She reports a few days ago she had a small amount of swelling to her legs but thought it was probably from her socks being tight. She denies that it was one side more than the other or that there was pain associated. She has not had fever or cough. She denies any exacerbation by food. Certain movements might make it worse. The pain may last minutes but not hours. The patient has a prior history of PE. She reports it occurred about 3 years ago after a hysterectomy. She reports she is no longer on Coumadin. She reports the reason she came to the emergency department was for concern of possible recurrence of PE. Past Medical History  Diagnosis Date  . Thyroid disease   . PE (pulmonary embolism)   . Pulmonary infarction   . H/O hysterectomy for benign disease   . Chest pain   . Hypertension    Past Surgical History  Procedure Laterality Date  . Abdominal hysterectomy     Family History  Problem Relation Age of Onset  . Diabetes    . Hypertension    . Cancer    . Asthma     History  Substance Use Topics  . Smoking status: Never Smoker   . Smokeless tobacco: Not on file  . Alcohol Use: No   OB History    No data available     Review of Systems  10 Systems reviewed and are negative for acute change except as noted in the HPI.   Allergies  Shellfish allergy  Home Medications   Prior to Admission medications    Medication Sig Start Date End Date Taking? Authorizing Provider  levothyroxine (SYNTHROID, LEVOTHROID) 75 MCG tablet Take 75 mcg by mouth daily.   Yes Historical Provider, MD  metoprolol tartrate (LOPRESSOR) 25 MG tablet Take 1 tablet (25 mg total) by mouth 2 (two) times daily. 08/15/12  Yes Wendall StadePeter C Nishan, MD  ibuprofen (ADVIL,MOTRIN) 600 MG tablet Take 1 tablet (600 mg total) by mouth every 6 (six) hours as needed. 02/14/14   Arby BarretteMarcy Amilee Janvier, MD  warfarin (COUMADIN) 5 MG tablet Take 5 mg by mouth as directed.    Historical Provider, MD   BP 142/75 mmHg  Pulse 84  Temp(Src) 97.8 F (36.6 C) (Oral)  Resp 16  Ht 5' 6.5" (1.689 m)  Wt 145 lb (65.772 kg)  BMI 23.06 kg/m2  SpO2 100% Physical Exam  Constitutional: She is oriented to person, place, and time. She appears well-developed and well-nourished.  HENT:  Head: Normocephalic and atraumatic.  Eyes: EOM are normal. Pupils are equal, round, and reactive to light.  Neck: Neck supple.  Cardiovascular: Normal rate, regular rhythm, normal heart sounds and intact distal pulses.   Pulmonary/Chest: Effort normal and breath sounds normal.  Abdominal: Soft. Bowel sounds are normal. She exhibits no distension. There is no tenderness.  Musculoskeletal: Normal range of motion. She exhibits no edema.  Neurological: She  is alert and oriented to person, place, and time. She has normal strength. Coordination normal. GCS eye subscore is 4. GCS verbal subscore is 5. GCS motor subscore is 6.  Skin: Skin is warm, dry and intact.  Psychiatric: She has a normal mood and affect.    ED Course  Procedures (including critical care time) Labs Review Labs Reviewed  CBC - Abnormal; Notable for the following:    RBC 5.27 (*)    MCV 73.8 (*)    MCH 24.5 (*)    All other components within normal limits  COMPREHENSIVE METABOLIC PANEL - Abnormal; Notable for the following:    Glucose, Bld 102 (*)    All other components within normal limits  TROPONIN I  APTT   PROTIME-INR    Imaging Review Ct Angio Chest Pe W/cm &/or Wo Cm  02/14/2014   CLINICAL DATA:  Chest pain for 1 week  EXAM: CT ANGIOGRAPHY CHEST WITH CONTRAST  TECHNIQUE: Multidetector CT imaging of the chest was performed using the standard protocol during bolus administration of intravenous contrast. Multiplanar CT image reconstructions and MIPs were obtained to evaluate the vascular anatomy.  CONTRAST:  OMNIPAQUE IOHEXOL 350 MG/ML SOLN  COMPARISON:  05/09/2011  FINDINGS: There are no filling defects in the pulmonary arterial tree to suggest acute pulmonary thromboembolism  No abnormal adenopathy.  Normal-appearing bilateral axillary nodes.  Normal-appearing thyroid gland.  No pericardial effusion.  No pleural effusion.  No pneumothorax.  Right C7 cervical rib.  Minimal dependent atelectasis bilaterally.  No acute bony deformity.  13 mm left adrenal adenoma.  Review of the MIP images confirms the above findings.  IMPRESSION: No evidence of acute pulmonary thromboembolism. Previously seen emboli have resolved from 2013.   Electronically Signed   By: Maryclare Bean M.D.   On: 02/14/2014 11:07     EKG Interpretation   Date/Time:  Thursday February 14 2014 10:15:06 EST Ventricular Rate:  75 PR Interval:  126 QRS Duration: 84 QT Interval:  396 QTC Calculation: 442 R Axis:   59 Text Interpretation:  Normal sinus rhythm Nonspecific T wave abnormality  Abnormal ECG agree. no change. Confirmed by Donnald Garre, MD, Lebron Conners [id number]) on  02/14/2014 10:32:46 AM      MDM   Final diagnoses:  Pleuritic chest pain   PE study and diagnostic workup are within normal limits. This time the patient is well in appearance with intermittent symptoms of pleuritic quality thoracic chest pain. She'll be treated symptomatically with ibuprofen for pain. She is no longer anticoagulated after resolution of her PE from 3 years ago. At this I do feel she is safe for continued outpatient evaluation and monitoring. She is  counseled on signs and symptoms for which return.    Arby Barrette, MD 02/14/14 1155

## 2014-02-14 NOTE — ED Notes (Addendum)
C/o chest soreness to mid chest worse with movement. Pain radiates to epigastric area. No other sx.Started exercising about time pain started. No pain at rest.

## 2016-01-29 ENCOUNTER — Other Ambulatory Visit: Payer: Self-pay | Admitting: Specialist

## 2016-01-29 ENCOUNTER — Telehealth (HOSPITAL_COMMUNITY): Payer: Self-pay | Admitting: Specialist

## 2016-01-29 DIAGNOSIS — R079 Chest pain, unspecified: Secondary | ICD-10-CM

## 2016-02-18 ENCOUNTER — Ambulatory Visit (INDEPENDENT_AMBULATORY_CARE_PROVIDER_SITE_OTHER): Payer: Commercial Managed Care - HMO

## 2016-02-18 DIAGNOSIS — R079 Chest pain, unspecified: Secondary | ICD-10-CM

## 2016-02-18 LAB — EXERCISE TOLERANCE TEST
CHL CUP MPHR: 162 {beats}/min
CSEPEDS: 30 s
CSEPEW: 9.3 METS
CSEPHR: 104 %
CSEPPHR: 169 {beats}/min
Exercise duration (min): 7 min
RPE: 16
Rest HR: 100 {beats}/min

## 2016-02-18 NOTE — Telephone Encounter (Signed)
Close encounter 

## 2016-03-08 ENCOUNTER — Encounter (HOSPITAL_COMMUNITY): Payer: Commercial Managed Care - HMO

## 2016-03-16 ENCOUNTER — Ambulatory Visit (HOSPITAL_COMMUNITY): Payer: Commercial Managed Care - HMO

## 2016-03-17 ENCOUNTER — Ambulatory Visit (HOSPITAL_COMMUNITY): Payer: Commercial Managed Care - HMO

## 2016-08-19 ENCOUNTER — Encounter: Payer: Self-pay | Admitting: Gastroenterology

## 2016-09-29 ENCOUNTER — Ambulatory Visit (AMBULATORY_SURGERY_CENTER): Payer: Self-pay

## 2016-09-29 VITALS — Ht 66.5 in | Wt 163.2 lb

## 2016-09-29 DIAGNOSIS — Z1211 Encounter for screening for malignant neoplasm of colon: Secondary | ICD-10-CM

## 2016-09-29 MED ORDER — NA SULFATE-K SULFATE-MG SULF 17.5-3.13-1.6 GM/177ML PO SOLN: 1.0000 | Freq: Once | ORAL | 0 refills | Status: AC

## 2016-09-29 NOTE — Progress Notes (Signed)
Denies allergies to eggs or soy products. Denies complication of anesthesia or sedation. Denies use of weight loss medication. Denies use of O2.   Emmi instructions given for colonoscopy.  

## 2016-09-30 ENCOUNTER — Encounter: Payer: Self-pay | Admitting: Gastroenterology

## 2016-10-13 ENCOUNTER — Encounter: Payer: Commercial Managed Care - HMO | Admitting: Gastroenterology

## 2017-06-09 ENCOUNTER — Encounter: Payer: Self-pay | Admitting: Nurse Practitioner

## 2018-03-30 ENCOUNTER — Other Ambulatory Visit: Payer: Self-pay

## 2018-03-30 ENCOUNTER — Emergency Department (HOSPITAL_BASED_OUTPATIENT_CLINIC_OR_DEPARTMENT_OTHER): Payer: 59

## 2018-03-30 ENCOUNTER — Encounter (HOSPITAL_BASED_OUTPATIENT_CLINIC_OR_DEPARTMENT_OTHER): Payer: Self-pay | Admitting: *Deleted

## 2018-03-30 ENCOUNTER — Emergency Department (HOSPITAL_BASED_OUTPATIENT_CLINIC_OR_DEPARTMENT_OTHER)
Admission: EM | Admit: 2018-03-30 | Discharge: 2018-03-30 | Disposition: A | Discharge status: Home / Self Care | Payer: 59 | Attending: Emergency Medicine | Admitting: Emergency Medicine

## 2018-03-30 DIAGNOSIS — I10 Essential (primary) hypertension: Secondary | ICD-10-CM | POA: Diagnosis not present

## 2018-03-30 DIAGNOSIS — J45909 Unspecified asthma, uncomplicated: Secondary | ICD-10-CM | POA: Insufficient documentation

## 2018-03-30 DIAGNOSIS — E079 Disorder of thyroid, unspecified: Secondary | ICD-10-CM | POA: Diagnosis not present

## 2018-03-30 DIAGNOSIS — Z79899 Other long term (current) drug therapy: Secondary | ICD-10-CM | POA: Diagnosis not present

## 2018-03-30 DIAGNOSIS — R079 Chest pain, unspecified: Secondary | ICD-10-CM | POA: Diagnosis not present

## 2018-03-30 LAB — CBC
HEMATOCRIT: 37.5 % (ref 36.0–46.0)
Hemoglobin: 11.7 g/dL — ABNORMAL LOW (ref 12.0–15.0)
MCH: 24.4 pg — ABNORMAL LOW (ref 26.0–34.0)
MCHC: 31.2 g/dL (ref 30.0–36.0)
MCV: 78.1 fL — ABNORMAL LOW (ref 80.0–100.0)
Platelets: 318 10*3/uL (ref 150–400)
RBC: 4.8 MIL/uL (ref 3.87–5.11)
RDW: 14.2 % (ref 11.5–15.5)
WBC: 6.9 10*3/uL (ref 4.0–10.5)
nRBC: 0 % (ref 0.0–0.2)

## 2018-03-30 LAB — BASIC METABOLIC PANEL
Anion gap: 5 (ref 5–15)
BUN: 18 mg/dL (ref 6–20)
CHLORIDE: 104 mmol/L (ref 98–111)
CO2: 28 mmol/L (ref 22–32)
Calcium: 8.9 mg/dL (ref 8.9–10.3)
Creatinine, Ser: 0.93 mg/dL (ref 0.44–1.00)
GFR calc Af Amer: >60 mL/min (ref >60)
GFR calc non Af Amer: >60 mL/min (ref >60)
Glucose, Bld: 107 mg/dL — ABNORMAL HIGH (ref 70–99)
Potassium: 3.5 mmol/L (ref 3.5–5.1)
Sodium: 137 mmol/L (ref 135–145)

## 2018-03-30 LAB — PREGNANCY, URINE: Preg Test, Ur: NEGATIVE

## 2018-03-30 LAB — TROPONIN I
Troponin I: <0.03 ng/mL (ref <0.03)
Troponin I: <0.03 ng/mL (ref <0.03)

## 2018-03-30 LAB — D-DIMER, QUANTITATIVE: D-Dimer, Quant: 0.41 ug/mL-FEU (ref 0.00–0.50)

## 2018-03-30 MED ORDER — SODIUM CHLORIDE 0.9% FLUSH
3.0000 mL | Freq: Once | INTRAVENOUS | Status: DC
  Filled 2018-03-30: qty 3

## 2018-03-30 NOTE — Discharge Instructions (Signed)
Follow-up with your primary care doctor to further discuss your chest pain.  Return to the ED if symptoms worsen.

## 2018-03-30 NOTE — ED Triage Notes (Signed)
Chest pain this am. [facility] and told to come here.

## 2018-03-30 NOTE — ED Provider Notes (Signed)
[facility] EMERGENCY DEPARTMENT Provider Note   CSN: [id number] Arrival date & time: 03/30/18  1454    History   Chief Complaint Chief Complaint  Patient presents with  . Chest Pain    HPI [name] is a 63 y.o. female.     The history is provided by the patient.  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 Allergy   . Anemia   . Asthma   . Cataract   . Chest pain   . H/O hysterectomy for benign disease   . Heart murmur   . Hyperlipidemia   . Hypertension   . PE (pulmonary embolism)   . Pulmonary infarction (HCC)   . Thyroid disease     Patient Active Problem List   Diagnosis Date Noted  . PE (pulmonary embolism) 07/21/2011  . Chest pain 07/21/2011  . Pulmonary infarction (HCC) 05/09/2011  . Pulmonary embolism (HCC) 05/09/2011  . H/O hysterectomy for benign disease 05/09/2011  . Thyroid disease 05/09/2011    Past Surgical History:  Procedure Laterality Date  . ABDOMINAL HYSTERECTOMY       OB History   No obstetric history on file.      Home Medications    Prior to Admission medications   Medication Sig Start Date End Date Taking? Authorizing Provider  metoprolol tartrate (LOPRESSOR) 25 MG tablet Take 1 tablet (25 mg total) by  mouth 2 (two) times daily. 08/15/12  Yes Wendall Stade, MD  triamterene-hydrochlorothiazide (DYAZIDE) 37.5-25 MG capsule Take 1 capsule by mouth daily.   Yes [provider]  ibuprofen (ADVIL,MOTRIN) 600 MG tablet Take 1 tablet (600 mg total) by mouth every 6 (six) hours as needed. 02/14/14   Arby Barrette, MD  Multiple Vitamin (MULTIVITAMIN) tablet Take 1 tablet by mouth daily.    [provider]    Family History Family History  Problem Relation Age of Onset  . Diabetes Other   . Hypertension Other   . Cancer Other   . Asthma Other   . Colon cancer Neg Hx   . Esophageal cancer Neg Hx   . Rectal cancer Neg Hx   . Stomach cancer Neg Hx   . Pancreatic cancer Neg Hx     Social History Social History   Tobacco Use  . Smoking status: Never Smoker  . Smokeless tobacco: Never Used  Substance Use Topics  . Alcohol use: No  . Drug use: No     Allergies   Shellfish allergy   Review of Systems Review of Systems  Constitutional: Negative for chills, fatigue and fever.  HENT: Negative for ear pain and sore throat.   Eyes: Negative for pain and visual disturbance.  Respiratory: Negative for cough and shortness of breath.  Cardiovascular: Positive for chest pain. Negative for palpitations and orthopnea.  Gastrointestinal: Negative for abdominal pain, anorexia, nausea and vomiting.  Genitourinary: Negative for dysuria and hematuria.  Musculoskeletal: Negative for arthralgias and back pain.  Skin: Negative for color change and rash.  Neurological: Negative for dizziness, seizures, syncope and numbness.  All other systems reviewed and are negative.    Physical Exam Updated Vital Signs  ED Triage Vitals  Enc Vitals Group     BP 03/30/18 1502 140/80     Pulse Rate 03/30/18 1502 92     Resp 03/30/18 1502 18     Temp 03/30/18 1502 97.8 F (36.6 C)     Temp Source 03/30/18 1502 Oral     SpO2 03/30/18 1502 100 %     Weight 03/30/18 1500 146 lb (66.2 kg)      Height 03/30/18 1500 5' 6.5" (1.689 m)     Head Circumference --      Peak Flow --      Pain Score 03/30/18 1500 5     Pain Loc --      Pain Edu? --      Excl. in GC? --     Physical Exam Vitals signs and nursing note reviewed.  Constitutional:      General: She is not in acute distress.    Appearance: She is well-developed.  HENT:     Head: Normocephalic and atraumatic.  Eyes:     Extraocular Movements: Extraocular movements intact.     Conjunctiva/sclera: Conjunctivae normal.     Pupils: Pupils are equal, round, and reactive to light.  Neck:     Musculoskeletal: Normal range of motion and neck supple.  Cardiovascular:     Rate and Rhythm: Normal rate and regular rhythm.     Pulses:          Radial pulses are 2+ on the right side and 2+ on the left side.       Dorsalis pedis pulses are 2+ on the right side and 2+ on the left side.     Heart sounds: Normal heart sounds. No murmur.  Pulmonary:     Effort: Pulmonary effort is normal. No respiratory distress.     Breath sounds: Normal breath sounds. No decreased breath sounds, wheezing, rhonchi or rales.  Abdominal:     Palpations: Abdomen is soft.     Tenderness: There is no abdominal tenderness.  Musculoskeletal: Normal range of motion.     Right lower leg: No edema.     Left lower leg: No edema.  Skin:    General: Skin is warm and dry.     Capillary Refill: Capillary refill takes less than 2 seconds.  Neurological:     General: No focal deficit present.     Mental Status: She is alert.      ED Treatments / Results  Labs (all labs ordered are listed, but only abnormal results are displayed) Labs Reviewed  BASIC METABOLIC PANEL - Abnormal; Notable for the following components:      Result Value   Glucose, Bld 107 (*)    All other components within normal limits  CBC - Abnormal; Notable for the following components:   Hemoglobin 11.7 (*)    MCV 78.1 (*)    MCH 24.4 (*)    All other components within normal  limits  TROPONIN I  PREGNANCY, URINE  D-DIMER, QUANTITATIVE (NOT AT [facility])  TROPONIN I    EKG EKG Interpretation  Date/Time:  Thursday March 30 2018 15:01:35 EST Ventricular Rate:  82 PR Interval:  132 QRS Duration: 80 QT Interval:  416 QTC Calculation: 486 R Axis:   72 Text Interpretation:  Normal sinus rhythm Possible Left atrial enlargement Prolonged QT Abnormal ECG Confirmed by Virgina Norfolk [phone]) on 03/30/2018 3:04:09 PM   Radiology Dg Chest 2 View  Result Date: 03/30/2018 CLINICAL DATA:  Left-sided chest pain into the left shoulder since early this morning. EXAM: CHEST - 2 VIEW COMPARISON:  Chest CT 02/14/2014 and CXR 06/10/2011 FINDINGS: The heart size and mediastinal contours are within normal limits. Both lungs are clear. Small vascular summation in the right infrahilar portion of the chest is felt to account for a small opacity best seen only on the frontal view. This is believed secondary to overlapping ribs and infrahilar pulmonary vasculature. The visualized skeletal structures are unremarkable. IMPRESSION: No active cardiopulmonary disease. Electronically Signed   By: Tollie Eth M.D.   On: 03/30/2018 15:22    Procedures Procedures (including critical care time)  Medications Ordered in ED Medications  sodium chloride flush (NS) 0.9 % injection 3 mL (3 mLs Intravenous Not Given 03/30/18 1551)     Initial Impression / Assessment and Plan / ED Course  I have reviewed the triage vital signs and the nursing notes.  Pertinent labs & imaging results that were available during my care of the patient were reviewed by me and considered in my medical decision making (see chart for details).        RAKYAH PORTA is a 63 year old female with history of hypertension, prior PE after surgery no longer anticoagulation who presents the ED with chest pain.  Patient with unremarkable vitals.  No fever.  EKG shows no signs of ischemic changes.  No ST elevation.  Overall  reassuring EKG.  Patient with left-sided chest pain that started about 12 hours ago and has now resolved.  Pain was worse with movement.  Does not have any shortness of breath.  No signs of volume overload on exam.  Has a heart score of 3.  Patient to get d-dimer due to history of PE.  Patient has clear breath sounds on exam.  No infectious symptoms.  Overall atypical story.  Will obtain serial troponins, basic lab work, chest x-ray.  Patient has already taken aspirin today.  Chest x-ray shows no signs of pneumonia, pneumothorax, pleural effusion.  No significant anemia, electrolyte abnormality, kidney injury, leukocytosis.  D-dimer negative and doubt PE.  Troponin negative x2 and doubt ACS.  Recommend follow-up with primary care doctor for further evaluation.  May benefit from outpatient stress test.  Given return precautions and discharged in ED in good condition.  This chart was dictated using voice recognition software.  Despite best efforts to proofread,  errors can occur which can change the documentation meaning.    Final Clinical Impressions(s) / ED Diagnoses   Final diagnoses:  Nonspecific chest pain    ED Discharge Orders    None       Virgina Norfolk, DO 03/30/18 1859

## 2018-03-30 NOTE — ED Notes (Signed)
ED Provider at bedside. 

## 2018-09-13 ENCOUNTER — Encounter: Payer: 59 | Attending: Family Medicine | Admitting: *Deleted

## 2018-09-13 ENCOUNTER — Other Ambulatory Visit: Payer: Self-pay

## 2018-09-13 DIAGNOSIS — E119 Type 2 diabetes mellitus without complications: Secondary | ICD-10-CM

## 2018-09-13 NOTE — Patient Instructions (Signed)
Plan:  Aim for 2 Carb Choices per meal (30 grams) +/- 1 either way  Aim for 0-1 Carbs per snack if hungry  Include protein in moderation with your meals and snacks Consider reading food labels for Total Carbohydrate of foods Continue with your activity level by walking for 30-60 minutes daily as tolerated, GREAT JOB! Continue checking BG at alternate times per day  Continue taking medication as directed by MD

## 2018-09-19 NOTE — Progress Notes (Signed)
Diabetes Self-Management Education  Visit Type: First/Initial  Appt. Start Time: 0800 Appt. End Time: 0930  09/19/2018  [name], identified by name and date of birth, is a 63 y.o. female with a diagnosis of Diabetes: Type 2. Patient is on Diabetes medication to help control her blood sugars with A1c of 6.1%. She states she wants to control her blood sugars as effectively as possible to avoid complications. 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 She is testing daily and all BG appear to be within target ranges.  ASSESSMENT  There were no vitals taken for this visit. There is no height or weight on file to calculate BMI.  Diabetes Self-Management Education - 09/13/18 0808      Visit Information   Visit Type  First/Initial      Initial Visit   Diabetes Type  Type 2    Are you currently following a meal plan?  No    Are you taking your medications as prescribed?  Not on Medications      Health Coping   How would you rate your overall health?  Good      Psychosocial Assessment   Patient Belief/Attitude about Diabetes  Other (comment)   states "N/A"   Other persons present  Patient    Patient Concerns  Nutrition/Meal planning    Special Needs  None    Preferred Learning Style  No preference indicated    Learning Readiness  Change in progress    How often do you need to have someone help you when you read instructions, pamphlets, or other written materials from your doctor or pharmacy?  1 - Never    What is the last grade level you completed in school?  4 years college  BA      Complications   Last HgB A1C per patient/outside source  6.1 %    How often do you check your blood sugar?  1-2 times/day    Fasting Blood glucose range (mg/dL)  [phone]    Postprandial Blood glucose range (mg/dL)  [phone]    Have you had a dilated eye exam in the past 12 months?  No    Have you had a dental exam in the past 12 months?  Yes    Are you checking your feet?  Yes    How many days per week are you checking your feet?  4      Dietary Intake   Breakfast  8 AM oatmeal OR eggs with spinach OR Belvita cookies OR eggs with grits and sausage or bacon    Snack (morning)  Belvita crackers, gold fish, raisins, fresh fruit    Lunch  12-2: PNB or tuna sandwich OR left overs OR occasionally eats out at sit down restarant    Snack (afternoon)  same as AM    Dinner  trying to cook at home more often: vegetablesx 2-3 with lean meat like chicken, eats salad about 4 days a week    Snack (evening)  occasionally same as AM    Beverage(s)  milk, water, hot tea with flavored creamer or honey and milk,      Exercise   Exercise Type  Light (walking / raking leaves)   walks 30 minutes to an hour almost every day   How many days per week to you exercise?  4    How many minutes per day do you exercise?  45    Total minutes per week of exercise  180      Patient Education   Previous Diabetes Education  No    Disease state   Factors that contribute to the development of diabetes    Nutrition management   Role of diet in the treatment of diabetes and the relationship between the three main macronutrients and blood glucose level;Food label reading, portion sizes and measuring food.;Carbohydrate counting    Physical activity and exercise   Role of exercise on diabetes management, blood pressure control and cardiac health.;Helped patient identify appropriate exercises in relation to his/her diabetes, diabetes complications and other health issue.    Medications  Reviewed patients medication for diabetes, action, purpose, timing of dose and side effects.    Monitoring  Identified appropriate SMBG and/or A1C goals.    Chronic complications  Relationship between chronic complications and blood glucose control    Psychosocial adjustment  Role of stress on diabetes      Individualized Goals (developed by patient)   Nutrition  Follow meal plan  discussed    Physical Activity  Exercise 3-5 times per week    Medications  take my medication as prescribed    Monitoring   test blood glucose pre and post meals as discussed      Post-Education Assessment   Patient understands the diabetes disease and treatment process.  Demonstrates understanding / competency    Patient understands incorporating nutritional management into lifestyle.  Demonstrates understanding / competency    Patient undertands incorporating physical activity into lifestyle.  Demonstrates understanding / competency    Patient understands using medications safely.  Demonstrates understanding / competency    Patient understands monitoring blood glucose, interpreting and using results  Demonstrates understanding / competency    Patient understands prevention, detection, and treatment of acute complications.  Demonstrates understanding / competency    Patient understands prevention, detection, and treatment of chronic complications.  Demonstrates understanding / competency    Patient understands how to develop strategies to address psychosocial issues.  Demonstrates understanding / competency    Patient understands how to develop strategies to promote health/change behavior.  Demonstrates understanding / competency      Outcomes   Expected Outcomes  Demonstrated interest in learning. Expect positive outcomes    Future DMSE  PRN    Program Status  Not Completed       Individualized Plan for Diabetes Self-Management Training:   Learning Objective:  Patient will have a greater understanding of diabetes self-management. Patient education plan is to attend individual and/or group sessions per assessed needs and concerns.   Plan:   Patient Instructions  Plan:  Aim for 2 Carb Choices per meal (30 grams) +/- 1 either way  Aim for 0-1 Carbs per snack if hungry  Include protein in moderation with your meals and snacks Consider reading food labels for Total Carbohydrate of  foods Continue with your activity level by walking for 30-60 minutes daily as tolerated, GREAT JOB! Continue checking BG at alternate times per day  Continue taking medication as directed by MD  Expected Outcomes:  Demonstrated interest in learning. Expect positive outcomes  Education material provided: Food label handouts, A1C conversion sheet, Meal plan card and Carbohydrate counting sheet  If problems or questions, patient to contact team via:  Phone  Future DSME appointment: PRN

## 2021-03-28 ENCOUNTER — Encounter (HOSPITAL_BASED_OUTPATIENT_CLINIC_OR_DEPARTMENT_OTHER): Payer: Self-pay

## 2021-03-28 ENCOUNTER — Other Ambulatory Visit: Payer: Self-pay

## 2021-03-28 ENCOUNTER — Emergency Department (HOSPITAL_BASED_OUTPATIENT_CLINIC_OR_DEPARTMENT_OTHER): Payer: BLUE CROSS/BLUE SHIELD

## 2021-03-28 ENCOUNTER — Emergency Department (HOSPITAL_BASED_OUTPATIENT_CLINIC_OR_DEPARTMENT_OTHER)
Admission: EM | Admit: 2021-03-28 | Discharge: 2021-03-28 | Disposition: A | Discharge status: Home / Self Care | Payer: BLUE CROSS/BLUE SHIELD | Attending: Emergency Medicine | Admitting: Emergency Medicine

## 2021-03-28 DIAGNOSIS — Z20822 Contact with and (suspected) exposure to covid-19: Secondary | ICD-10-CM | POA: Diagnosis not present

## 2021-03-28 DIAGNOSIS — R000 Tachycardia, unspecified: Secondary | ICD-10-CM | POA: Diagnosis not present

## 2021-03-28 DIAGNOSIS — J069 Acute upper respiratory infection, unspecified: Secondary | ICD-10-CM

## 2021-03-28 DIAGNOSIS — R059 Cough, unspecified: Secondary | ICD-10-CM | POA: Diagnosis present

## 2021-03-28 LAB — RESP PANEL BY RT-PCR (FLU A&B, COVID) ARPGX2
Influenza A by PCR: NEGATIVE
Influenza B by PCR: NEGATIVE
SARS Coronavirus 2 by RT PCR: NEGATIVE

## 2021-03-28 MED ORDER — AMOXICILLIN-POT CLAVULANATE 875-125 MG PO TABS: 1.0000 | ORAL_TABLET | Freq: Two times a day (BID) | ORAL | 0 refills | Status: DC

## 2021-03-28 MED ORDER — ALBUTEROL SULFATE HFA 108 (90 BASE) MCG/ACT IN AERS: 1.0000 | INHALATION_SPRAY | Freq: Four times a day (QID) | RESPIRATORY_TRACT | 0 refills | Status: DC | PRN

## 2021-03-28 NOTE — ED Provider Notes (Signed)
[facility] EMERGENCY DEPARTMENT Provider Note   CSN: [id number] Arrival date & time: 03/28/21  1103     History  Chief Complaint  Patient presents with   Nasal Congestion    [name] is a 66 y.o. female who presents to the ED for evaluation of productive cough, congestion and sore throat that has been ongoing for 11 days.  She also endorses sore throat, worse on the left. Patient states that she went to urgent care last week and was sent home with [facility] without any relief.  Her symptoms continue to worsen.  She denies fevers, chills.  She is also been taking Mucinex at home without any significant improvement.  She denies GI symptoms, urinary symptoms.  She has no other complaints.  HPI     Home Medications Prior to Admission medications   Medication Sig Start Date End Date Taking? Authorizing Provider  albuterol (VENTOLIN HFA) 108 (90 Base) MCG/ACT inhaler Inhale 1-2 puffs into the lungs every 6 (six) hours as needed for wheezing or shortness of breath. 03/28/21  Yes Kathe Becton R, PA-C  amoxicillin-clavulanate (AUGMENTIN) 875-125 MG tablet Take 1 tablet by mouth every 12 (twelve) hours. 03/28/21  Yes Tonye Pearson, PA-C  Alogliptin Benzoate (NESINA) 12.5 MG TABS Take 1 tablet by mouth daily.    [provider]  ibuprofen (ADVIL,MOTRIN) 600 MG tablet Take 1 tablet (600 mg total) by mouth every 6 (six) hours as needed. 02/14/14   Charlesetta Shanks, MD  metoprolol tartrate (LOPRESSOR) 25 MG tablet Take 1 tablet (25 mg total) by mouth 2 (two) times daily. 08/15/12   Josue Hector, MD  Multiple Vitamin (MULTIVITAMIN) tablet Take 1 tablet by mouth daily.    [provider]  triamterene-hydrochlorothiazide (DYAZIDE) 37.5-25 MG capsule Take 1 capsule by mouth daily.    [provider]      Allergies    Shellfish allergy    Review of Systems   Review of Systems  HENT:  Positive for congestion.   Respiratory:  Positive for cough.     Physical Exam Updated Vital Signs BP (!) 171/88 (BP Location: Right Arm)    Pulse (!) 101    Temp 98.2 F (36.8 C) (Oral)    Resp 18    Ht 5\' 6"  (1.676 m)    Wt 72.6 kg    SpO2 100%    BMI 25.82 kg/m  Physical Exam Vitals and nursing note reviewed.  Constitutional:      General: She is not in acute distress.    Appearance: She is ill-appearing.     Comments: Frontal and maxillary tenderness  HENT:     Head: Atraumatic. No Battle's sign.     Mouth/Throat:     Pharynx: Uvula midline.     Tonsils: Tonsillar exudate present. 1+ on the right. 2+ on the left.     Comments: Bilateral tonsillar erythema, however exudate noted on left tonsil which seems slightly more swollen than the right. Uvula midline Eyes:     Conjunctiva/sclera: Conjunctivae normal.  Cardiovascular:     Rate and Rhythm: Regular rhythm. Tachycardia present.     Pulses: Normal pulses.     Heart sounds: No murmur heard. Pulmonary:     Effort: Pulmonary effort is normal. No respiratory distress.     Breath sounds: Normal breath sounds.  Abdominal:     General: Abdomen is flat. There is no distension.     Palpations: Abdomen is soft.  Tenderness: There is no abdominal tenderness.  Musculoskeletal:        General: Normal range of motion.     Cervical back: Normal range of motion.  Skin:    General: Skin is warm and dry.     Capillary Refill: Capillary refill takes less than 2 seconds.  Neurological:     General: No focal deficit present.     Mental Status: She is alert.  Psychiatric:        Mood and Affect: Mood normal.    ED Results / Procedures / Treatments   Labs (all labs ordered are listed, but only abnormal results are displayed) Labs Reviewed  RESP PANEL BY RT-PCR (FLU A&B, COVID) [id number]    EKG None  Radiology DG Chest 2 View  Result Date: 03/28/2021 CLINICAL DATA:  Cough, chest congestion EXAM: CHEST - 2 VIEW COMPARISON:  03/30/2018 FINDINGS: The heart size and mediastinal contours are  within normal limits. Both lungs are clear. The visualized skeletal structures are unremarkable. IMPRESSION: No active cardiopulmonary disease. Electronically Signed   By: Elmer Picker M.D.   On: 03/28/2021 12:58    Procedures Procedures    Medications Ordered in ED Medications - No data to display  ED Course/ Medical Decision Making/ A&P                           Medical Decision Making Amount and/or Complexity of Data Reviewed Radiology: ordered.  Risk Prescription drug management.   History:  Per HPI  Initial impression:  This patient presents to the ED for concern of cough and congestion, this involves an extensive number of treatment options, and is a complaint that carries with it a high risk of complications and morbidity.   Ddx includes but not limited to sinusitis, pneumonia, bronchitis  ED Course: This is a 66 year old ill-appearing although nontoxic female who presents for 1.5-week history of worsening congestion and cough.  Physical exam was benign aside from sinus tenderness.  Lungs clear to auscultation bilaterally.  She does endorse frequent wheezing sensation that was not appreciated on exam today.  Chest x-ray negative for infiltrate or acute abnormality.  Respiratory panel negative. I Ordered, reviewed, and interpreted labs  I independently visualized and interpreted imaging and I agree with the radiologist interpretation.   Disposition:  After consideration of the diagnostic results, physical exam, history and the patients response to treatment feel that the patent would benefit from discharge with outpatient follow up.   URI: Given the length of patient's symptoms and that she has been previously seen by urgent care over a week ago and her symptoms continue to worsen, antibiotics do seem clinically warranted at this time.  Additionally, her left tonsil is erythematous with exudate, and I have concern it may progress to tonsillar abscess. I have sent in a  prescription for Augmentin to take twice daily for a week.  Additionally sent albuterol inhaler for intermittent wheezing.  Return precautions were discussed.  All questions were asked and answered.  She is to follow-up outpatient with her PCP if she continues to decline.  Patient is understanding amenable to plan and discharged home in good condition.    Final Clinical Impression(s) / ED Diagnoses Final diagnoses:  Upper respiratory tract infection, unspecified type    Rx / DC Orders ED Discharge Orders          Ordered    albuterol (VENTOLIN HFA) 108 (90 Base) MCG/ACT inhaler  Every 6  hours PRN        03/28/21 1318    amoxicillin-clavulanate (AUGMENTIN) 875-125 MG tablet  Every 12 hours        03/28/21 1318              Tonye Pearson, [place] 03/29/21 0933    Truddie Hidden, MD 03/29/21 [phone]

## 2021-03-28 NOTE — Discharge Instructions (Addendum)
Your labs were negative for COVID/flu today, and your chest xray was negative for signs of pneumonia. Your throat did look red with some white discharge on the left tonsil.  Since your symptoms have been ongoing for close to two weeks, I will send you in an antibiotic for you to take twice daily for a week. I have also sent in an inhaler which you can take when you feel wheezy.  If you notice that tonsil becomes more swollen or painful, please return to the ED for reevaluation.

## 2021-03-28 NOTE — ED Triage Notes (Signed)
Pt states for over a week she has been 'feeling under the weather' states that she was seen at [facility] but is not getting any better. States that she has been coughing, very congested and having headaches. Pt was tested for covid and flu at [facility] both were negative. Was not given any antibiotics, was given something for cough.

## 2021-08-31 ENCOUNTER — Emergency Department (HOSPITAL_BASED_OUTPATIENT_CLINIC_OR_DEPARTMENT_OTHER)
Admission: EM | Admit: 2021-08-31 | Discharge: 2021-08-31 | Disposition: A | Discharge status: Home / Self Care | Payer: BLUE CROSS/BLUE SHIELD | Attending: Emergency Medicine | Admitting: Emergency Medicine

## 2021-08-31 ENCOUNTER — Encounter (HOSPITAL_BASED_OUTPATIENT_CLINIC_OR_DEPARTMENT_OTHER): Payer: Self-pay | Admitting: *Deleted

## 2021-08-31 DIAGNOSIS — R1084 Generalized abdominal pain: Secondary | ICD-10-CM | POA: Diagnosis not present

## 2021-08-31 DIAGNOSIS — Z79899 Other long term (current) drug therapy: Secondary | ICD-10-CM | POA: Insufficient documentation

## 2021-08-31 DIAGNOSIS — I10 Essential (primary) hypertension: Secondary | ICD-10-CM | POA: Diagnosis not present

## 2021-08-31 DIAGNOSIS — K5900 Constipation, unspecified: Secondary | ICD-10-CM | POA: Diagnosis present

## 2021-08-31 MED ORDER — POLYETHYLENE GLYCOL 3350 17 GM/SCOOP PO POWD: 1.0000 | Freq: Once | ORAL | 0 refills | Status: AC

## 2021-08-31 MED ORDER — GLYCERIN (LAXATIVE) 2.1 G RE SUPP
1.0000 | Freq: Once | RECTAL | Status: AC
  Administered 2021-08-31: 1 via RECTAL
  Filled 2021-08-31: qty 1

## 2021-08-31 MED ORDER — GLYCERIN (ADULT) 2 G RE SUPP: 1.0000 | RECTAL | 0 refills | Status: DC | PRN

## 2021-08-31 NOTE — ED Provider Notes (Signed)
[facility] EMERGENCY DEPARTMENT Provider Note   CSN: [id number] Arrival date & time: 08/31/21  8185     History  Chief Complaint  Patient presents with   Constipation    [name] is a 66 y.o. female.   Constipation   66 year old female with medical history significant for PE, thyroid disease, HTN, anemia, HLD who presents to the emergency department with generalized abdominal discomfort.  The patient states she has not had a bowel movement in the past 5 days.  She states that she has normally regular.  She has been using over-the-counter senna and Dulcolax without relief.  She states that she has been using 1 capful of Dulcolax a day.  She states that she is passing gas.  She denies any abdominal pain.  She denies any nausea or vomiting.  She denies any diarrhea.  She denies any fevers or chills.  She is tolerating oral intake.  Home Medications Prior to Admission medications   Medication Sig Start Date End Date Taking? Authorizing Provider  glycerin adult 2 g suppository Place 1 suppository rectally as needed for constipation. 08/31/21  Yes Ernie Avena, MD  polyethylene glycol powder (GLYCOLAX/MIRALAX) 17 GM/SCOOP powder Take 255 g by mouth once for 1 dose. 08/31/21 08/31/21 Yes Ernie Avena, MD  albuterol (VENTOLIN HFA) 108 (90 Base) MCG/ACT inhaler Inhale 1-2 puffs into the lungs every 6 (six) hours as needed for wheezing or shortness of breath. 03/28/21   Janell Quiet, PA-C  Alogliptin Benzoate (NESINA) 12.5 MG TABS Take 1 tablet by mouth daily.    [provider]  amoxicillin-clavulanate (AUGMENTIN) 875-125 MG tablet Take 1 tablet by mouth every 12 (twelve) hours. 03/28/21   Janell Quiet, PA-C  ibuprofen (ADVIL,MOTRIN) 600 MG tablet Take 1 tablet (600 mg total) by mouth every 6 (six) hours as needed. 02/14/14   Arby Barrette, MD  metoprolol tartrate (LOPRESSOR) 25 MG tablet Take 1 tablet (25 mg total) by mouth 2 (two) times daily. 08/15/12    Wendall Stade, MD  Multiple Vitamin (MULTIVITAMIN) tablet Take 1 tablet by mouth daily.    [provider]  triamterene-hydrochlorothiazide (DYAZIDE) 37.5-25 MG capsule Take 1 capsule by mouth daily.    [provider]      Allergies    Shellfish allergy    Review of Systems   Review of Systems  Gastrointestinal:  Positive for constipation.  All other systems reviewed and are negative.   Physical Exam Updated Vital Signs BP (!) 157/84 (BP Location: Right Arm)   Pulse 68   Temp 97.6 F (36.4 C) (Oral)   Resp 18   Ht 5\' 7"  (1.702 m)   Wt 73 kg   SpO2 99%   BMI 25.21 kg/m  Physical Exam Vitals and nursing note reviewed.  Constitutional:      General: She is not in acute distress.    Appearance: She is well-developed.  HENT:     Head: Normocephalic and atraumatic.  Eyes:     Conjunctiva/sclera: Conjunctivae normal.  Cardiovascular:     Rate and Rhythm: Normal rate and regular rhythm.     Heart sounds: No murmur heard. Pulmonary:     Effort: Pulmonary effort is normal. No respiratory distress.     Breath sounds: Normal breath sounds.  Abdominal:     Palpations: Abdomen is soft.     Tenderness: There is no abdominal tenderness.  Musculoskeletal:        General: No swelling.  Cervical back: Neck supple.  Skin:    General: Skin is warm and dry.     Capillary Refill: Capillary refill takes less than 2 seconds.  Neurological:     Mental Status: She is alert.  Psychiatric:        Mood and Affect: Mood normal.     ED Results / Procedures / Treatments   Labs (all labs ordered are listed, but only abnormal results are displayed) Labs Reviewed - No data to display  EKG None  Radiology No results found.  Procedures Procedures    Medications Ordered in ED Medications  Glycerin (Adult) 2.1 g suppository 1 suppository (1 suppository Rectal Given 08/31/21 0737)    ED Course/ Medical Decision Making/ A&P                            Medical Decision Making Risk OTC drugs.   66 year old female with medical history significant for PE, thyroid disease, HTN, anemia, HLD who presents to the emergency department with generalized abdominal discomfort.  The patient states she has not had a bowel movement in the past 5 days.  She states that she has normally regular.  She has been using over-the-counter senna and Dulcolax without relief.  She states that she has been using 1 capful of Dulcolax a day.  She states that she is passing gas.  She denies any abdominal pain.  She denies any nausea or vomiting.  She denies any diarrhea.  She denies any fevers or chills.  She is tolerating oral intake.  On arrival, the patient was vitally stable.  Physical exam significant for an abdomen that was soft, nontender, nondistended, no rebound or guarding.  Patient presenting with concern for constipation with no bowel movement over the past 5 days.  She is relatively asymptomatic.  She is passing gas.  Low concern for small bowel obstruction.  Lower concern for fecal impaction.  Glycerin suppository was administered and the patient had a satisfactory bowel movement in the ED.  Stable for discharge.  DC Instructions: Utilize glycerin suppositories as needed for constipation.  Can try a MiraLAX bowel cleanout if concerned in the future with 4 capfuls of MiraLAX in a 32 ounce Gatorade, drink over 4 hours and increase fluid intake as well.  Can try again 1 day later to achieve a satisfactory bowel movement.  Follow-up with your PCP.  Final Clinical Impression(s) / ED Diagnoses Final diagnoses:  Constipation, unspecified constipation type    Rx / DC Orders ED Discharge Orders          Ordered    polyethylene glycol powder (GLYCOLAX/MIRALAX) 17 GM/SCOOP powder   Once        08/31/21 0834    glycerin adult 2 g suppository  As needed        08/31/21 0834              Ernie Avena, MD 08/31/21 [phone]

## 2021-08-31 NOTE — ED Triage Notes (Signed)
States has not had a BM in the past 5 days, has been using OTC meds but no relief

## 2021-08-31 NOTE — Discharge Instructions (Addendum)
Utilize glycerin suppositories as needed for constipation.  Can try a MiraLAX bowel cleanout if concerned in the future with 4 capfuls of MiraLAX in a 32 ounce Gatorade, drink over 4 hours and increase fluid intake as well.  Can try again 1 day later to achieve a satisfactory bowel movement.  Follow-up with your PCP.

## 2021-11-16 ENCOUNTER — Other Ambulatory Visit: Payer: Self-pay

## 2021-11-16 ENCOUNTER — Encounter (HOSPITAL_COMMUNITY): Payer: Self-pay

## 2021-11-16 ENCOUNTER — Emergency Department (HOSPITAL_COMMUNITY)
Admission: EM | Admit: 2021-11-16 | Discharge: 2021-11-16 | Disposition: A | Discharge status: Home / Self Care | Payer: BLUE CROSS/BLUE SHIELD | Attending: Emergency Medicine | Admitting: Emergency Medicine

## 2021-11-16 DIAGNOSIS — R739 Hyperglycemia, unspecified: Secondary | ICD-10-CM | POA: Diagnosis not present

## 2021-11-16 DIAGNOSIS — R42 Dizziness and giddiness: Secondary | ICD-10-CM | POA: Diagnosis present

## 2021-11-16 DIAGNOSIS — R110 Nausea: Secondary | ICD-10-CM | POA: Insufficient documentation

## 2021-11-16 LAB — CBC WITH DIFFERENTIAL/PLATELET
Abs Immature Granulocytes: 0.02 10*3/uL (ref 0.00–0.07)
Basophils Absolute: 0 10*3/uL (ref 0.0–0.1)
Basophils Relative: 0 %
Eosinophils Absolute: 0 10*3/uL (ref 0.0–0.5)
Eosinophils Relative: 1 %
HCT: 39.6 % (ref 36.0–46.0)
Hemoglobin: 12.9 g/dL (ref 12.0–15.0)
Immature Granulocytes: 0 %
Lymphocytes Relative: 31 %
Lymphs Abs: 2.2 10*3/uL (ref 0.7–4.0)
MCH: 24.7 pg — ABNORMAL LOW (ref 26.0–34.0)
MCHC: 32.6 g/dL (ref 30.0–36.0)
MCV: 75.7 fL — ABNORMAL LOW (ref 80.0–100.0)
Monocytes Absolute: 0.4 10*3/uL (ref 0.1–1.0)
Monocytes Relative: 6 %
Neutro Abs: 4.5 10*3/uL (ref 1.7–7.7)
Neutrophils Relative %: 62 %
Platelets: 326 10*3/uL (ref 150–400)
RBC: 5.23 MIL/uL — ABNORMAL HIGH (ref 3.87–5.11)
RDW: 14.8 % (ref 11.5–15.5)
WBC: 7.2 10*3/uL (ref 4.0–10.5)
nRBC: 0 % (ref 0.0–0.2)

## 2021-11-16 LAB — BASIC METABOLIC PANEL
Anion gap: 7 (ref 5–15)
BUN: 12 mg/dL (ref 8–23)
CO2: 30 mmol/L (ref 22–32)
Calcium: 9.8 mg/dL (ref 8.9–10.3)
Chloride: 106 mmol/L (ref 98–111)
Creatinine, Ser: 0.89 mg/dL (ref 0.44–1.00)
GFR, Estimated: >60 mL/min (ref >60)
Glucose, Bld: 160 mg/dL — ABNORMAL HIGH (ref 70–99)
Potassium: 4.7 mmol/L (ref 3.5–5.1)
Sodium: 143 mmol/L (ref 135–145)

## 2021-11-16 LAB — CBG MONITORING, ED: Glucose-Capillary: 110 mg/dL — ABNORMAL HIGH (ref 70–99)

## 2021-11-16 MED ORDER — PROCHLORPERAZINE EDISYLATE 10 MG/2ML IJ SOLN
10.0000 mg | Freq: Once | INTRAMUSCULAR | Status: AC
  Administered 2021-11-16: 10 mg via INTRAVENOUS
  Filled 2021-11-16: qty 2

## 2021-11-16 MED ORDER — MECLIZINE HCL 12.5 MG PO TABS: 12.5000 mg | ORAL_TABLET | Freq: Three times a day (TID) | ORAL | 0 refills | Status: DC | PRN

## 2021-11-16 MED ORDER — SODIUM CHLORIDE 0.9 % IV BOLUS
500.0000 mL | Freq: Once | INTRAVENOUS | Status: AC
  Administered 2021-11-16: 500 mL via INTRAVENOUS

## 2021-11-16 MED ORDER — DIPHENHYDRAMINE HCL 50 MG/ML IJ SOLN
12.5000 mg | Freq: Once | INTRAMUSCULAR | Status: AC
  Administered 2021-11-16: 12.5 mg via INTRAVENOUS
  Filled 2021-11-16: qty 1

## 2021-11-16 MED ORDER — DEXAMETHASONE SODIUM PHOSPHATE 10 MG/ML IJ SOLN
10.0000 mg | Freq: Once | INTRAMUSCULAR | Status: AC
  Administered 2021-11-16: 10 mg via INTRAVENOUS
  Filled 2021-11-16: qty 1

## 2021-11-16 NOTE — ED Provider Notes (Signed)
[facility]-EMERGENCY DEPT Provider Note   CSN: [id number] Arrival date & time: 11/16/21  1352     History  Chief Complaint  Patient presents with   Dizziness   Nausea   HPI [name] is a 66 y.o. female with history of pulmonary embolism and infarct 6 years ago presenting for dizziness and nausea.  Started yesterday morning while sitting at church.  Endorses a room spinning sensation states that it did come on all of a sudden.  Denies fall or loss of consciousness.  Symptoms lasted for a few minutes.  She went home and laid down and her symptoms improved.  She denies chest pain shortness of breath abdominal pain.  Denies headache and visual disturbance.  Denies fever.  Does endorse nausea.  States that she is dizzy now but her symptoms are not as bad as they were yesterday.  Also endorses that her balance has been off making it difficult to walk since yesterday.  Not on blood thinners.   Dizziness      Home Medications Prior to Admission medications   Medication Sig Start Date End Date Taking? Authorizing Provider  meclizine (ANTIVERT) 12.5 MG tablet Take 1 tablet (12.5 mg total) by mouth 3 (three) times daily as needed for dizziness. 11/16/21  Yes Gareth Eagle, PA-C  albuterol (VENTOLIN HFA) 108 (90 Base) MCG/ACT inhaler Inhale 1-2 puffs into the lungs every 6 (six) hours as needed for wheezing or shortness of breath. 03/28/21   Janell Quiet, PA-C  Alogliptin Benzoate (NESINA) 12.5 MG TABS Take 1 tablet by mouth daily.    [provider]  amoxicillin-clavulanate (AUGMENTIN) 875-125 MG tablet Take 1 tablet by mouth every 12 (twelve) hours. 03/28/21   Janell Quiet, PA-C  glycerin adult 2 g suppository Place 1 suppository rectally as needed for constipation. 08/31/21   Ernie Avena, MD  ibuprofen (ADVIL,MOTRIN) 600 MG tablet Take 1 tablet (600 mg total) by mouth every 6 (six) hours as needed. 02/14/14   Arby Barrette, MD  metoprolol  tartrate (LOPRESSOR) 25 MG tablet Take 1 tablet (25 mg total) by mouth 2 (two) times daily. 08/15/12   Wendall Stade, MD  Multiple Vitamin (MULTIVITAMIN) tablet Take 1 tablet by mouth daily.    [provider]  triamterene-hydrochlorothiazide (DYAZIDE) 37.5-25 MG capsule Take 1 capsule by mouth daily.    [provider]      Allergies    Shellfish allergy    Review of Systems   Review of Systems  Neurological:  Positive for dizziness.    Physical Exam Updated Vital Signs BP (!) 178/91   Pulse 89   Temp 97.9 F (36.6 C) (Oral)   Resp 18   Ht 5' 6.5" (1.689 m)   Wt 72.6 kg   SpO2 98%   BMI 25.44 kg/m  Physical Exam Vitals and nursing note reviewed.  HENT:     Head: Normocephalic and atraumatic.     Mouth/Throat:     Mouth: Mucous membranes are moist.  Eyes:     General:        Right eye: No discharge.        Left eye: No discharge.     Conjunctiva/sclera: Conjunctivae normal.  Cardiovascular:     Rate and Rhythm: Normal rate and regular rhythm.     Pulses: Normal pulses.     Heart sounds: Normal heart sounds.  Pulmonary:     Effort: Pulmonary effort is normal.     Breath  sounds: Normal breath sounds.  Abdominal:     General: Abdomen is flat.     Palpations: Abdomen is soft.  Skin:    General: Skin is warm and dry.  Neurological:     General: No focal deficit present.     Comments: GCS 15. Speech is goal oriented. No deficits appreciated to CN III-XII; symmetric eyebrow raise, no facial drooping, tongue midline. Patient has equal grip strength bilaterally with 5/5 strength against resistance in all major muscle groups bilaterally. Sensation to light touch intact. Patient moves extremities without ataxia. Normal finger-nose-finger. Patient ambulatory with steady gait.   Psychiatric:        Mood and Affect: Mood normal.     ED Results / Procedures / Treatments   Labs (all labs ordered are listed, but only abnormal results are displayed) Labs  Reviewed  BASIC METABOLIC PANEL - Abnormal; Notable for the following components:      Result Value   Glucose, Bld 160 (*)    All other components within normal limits  CBC WITH DIFFERENTIAL/PLATELET - Abnormal; Notable for the following components:   RBC 5.23 (*)    MCV 75.7 (*)    MCH 24.7 (*)    All other components within normal limits  CBG MONITORING, ED - Abnormal; Notable for the following components:   Glucose-Capillary 110 (*)    All other components within normal limits    EKG EKG Interpretation  Date/Time:  Monday November 16 2021 14:03:00 EDT Ventricular Rate:  56 PR Interval:  124 QRS Duration: 76 QT Interval:  414 QTC Calculation: 400 R Axis:   57 Text Interpretation: Sinus rhythm Atrial premature complex Borderline repolarization abnormality No significant change since last tracing Confirmed by Melene Plan [phone]) on 11/16/2021 2:30:18 PM  Radiology No results found.  Procedures Procedures    Medications Ordered in ED Medications  sodium chloride 0.9 % bolus 500 mL (500 mLs Intravenous New Bag/Given 11/16/21 1501)  dexamethasone (DECADRON) injection 10 mg (10 mg Intravenous Given 11/16/21 1504)  prochlorperazine (COMPAZINE) injection 10 mg (10 mg Intravenous Given 11/16/21 1505)  diphenhydrAMINE (BENADRYL) injection 12.5 mg (12.5 mg Intravenous Given 11/16/21 1504)    ED Course/ Medical Decision Making/ A&P                           Medical Decision Making  This patient presents to the ED for concern of dizziness, this involves a number of treatment options, and is a complaint that carries with it a high risk of complications and morbidity.  The differential diagnosis includes cardiac arrhythmia, electrolyte derangement, anemia, and CVA.   Co morbidities: Discussed in HPI   EMR reviewed including pt PMHx, past surgical history and past visits to ER.   See HPI for more details   Lab Tests:   I ordered and independently interpreted labs. Labs  notable for hyperglycemia   Imaging Studies:  No imaging studies ordered for this patient    Cardiac Monitoring:  The patient was maintained on a cardiac monitor.  I personally viewed and interpreted the cardiac monitored which showed an underlying rhythm of: NSR EKG non-ischemic   Medicines ordered:  I ordered medication including Decadron for empiric treatment of of labyrinthitis, Benadryl and Compazine for dizziness and fluid bolus for volume resuscitation. Reevaluation of the patient after these medicines showed that the patient improved I have reviewed the patients home medicines and have made adjustments as needed    Consults/Attending Physician  I discussed this case with my attending physician who cosigned this note including patient's presenting symptoms, physical exam, and planned diagnostics and interventions. Attending physician stated agreement with plan or made changes to plan which were implemented.   Reevaluation:  After the interventions noted above I re-evaluated patient and found that they have :improved.  Patient stated that nausea was much better and she was no longer experiencing dizziness.   Problem List / ED Course: Patient presented for dizziness and nausea.  Did consider central source for her dizziness and nausea but unlikely given no focal deficit on neuro exam, dizziness is improved with position, reassuring EKG, patient does not endorse headache, chest pain, and/or abdominal pain.  Treated symptoms with Compazine Decadron Benadryl and volume bolus.  Patient stated that her dizziness has completely gone away and that she was no longer nauseous.  We discussed return precautions.  Ordered meclizine for home.  Blood pressure was slightly elevated during this encounter.  Patient continued asymptomatic in the visit.  Advised her to follow-up with her PCP regarding elevated blood pressure.  States that she does take metoprolol daily and did take her  metoprolol this morning.   Dispostion:  After consideration of the diagnostic results and the patients response to treatment, I feel that the patient would benefit from discharge and follow-up with PCP regarding recent occurrence of dizziness and nausea.         Final Clinical Impression(s) / ED Diagnoses Final diagnoses:  Dizziness    Rx / DC Orders ED Discharge Orders          Ordered    meclizine (ANTIVERT) 12.5 MG tablet  3 times daily PRN        11/16/21 1629              Harriet Pho, PA-C 11/16/21 [place], [place], DO 11/16/21 1636

## 2021-11-16 NOTE — Discharge Instructions (Addendum)
Evaluation for your dizziness and nausea was overall reassuring.  Given that your dizziness is improved with position and only lasted a few minutes likely indicates a peripheral source for your symptoms.  Recommend that you take meclizine as needed for dizziness.  Also recommend that you follow-up with your PCP for your dizziness and nausea.  If you have new headache, visual disturbance, slurred speech, weakness in your extremities, new chest pain, shortness of breath or abdominal pain please return to the emergency department for further evaluation.

## 2021-11-16 NOTE — ED Triage Notes (Signed)
Patient c/o  dizziness and nausea x 1 week.  Patient denies headache, blurred vision, or vomiting.

## 2021-12-16 ENCOUNTER — Ambulatory Visit: Payer: BLUE CROSS/BLUE SHIELD | Admitting: Internal Medicine

## 2021-12-16 ENCOUNTER — Encounter: Payer: Self-pay | Admitting: Internal Medicine

## 2021-12-16 VITALS — BP 178/97 | HR 89 | Ht 65.5 in | Wt 169.4 lb

## 2021-12-16 DIAGNOSIS — Z86711 Personal history of pulmonary embolism: Secondary | ICD-10-CM

## 2021-12-16 DIAGNOSIS — I10 Essential (primary) hypertension: Secondary | ICD-10-CM

## 2021-12-16 DIAGNOSIS — E782 Mixed hyperlipidemia: Secondary | ICD-10-CM

## 2021-12-16 MED ORDER — METOPROLOL SUCCINATE ER 50 MG PO TB24: 50.0000 mg | ORAL_TABLET | Freq: Every day | ORAL | 3 refills | Status: DC

## 2021-12-16 MED ORDER — LISINOPRIL 20 MG PO TABS: 20.0000 mg | ORAL_TABLET | Freq: Every day | ORAL | 6 refills | Status: DC

## 2021-12-16 NOTE — Progress Notes (Signed)
Primary Physician/Referring:  Leilani Able, MD  Patient ID: [name], female    DOB: [birth year], 66 y.o.   MRN: [id number]  Chief Complaint  Patient presents with   Hypertension   New Patient (Initial Visit)        HPI:    [name]  is a 66 y.o. female with past medical history significant for hypertension, hyperlipidemia, and history of pulmonary embolus who is here to establish care with cardiology.  Patient's blood pressure is very uncontrolled today, she states she is aware that her pressures have been running quite high.  Patient is agreeable to switching her Lopressor to a long-acting pill.  Patient will need more than one antihypertensive pill to control her blood pressure.  She is okay with starting lisinopril.  Patient has not had a stress test or echocardiogram in over 10 years.  She denies any cardiac history other than hypertension that she knows of.  She denies chest pain, shortness of breath, diaphoresis, syncope, edema, orthopnea, PND, claudication.   Past Medical History:  Diagnosis Date   Allergy    Anemia    Asthma    Cataract    Chest pain    H/O hysterectomy for benign disease    Heart murmur    Hyperlipidemia    Hypertension    PE (pulmonary embolism)    Pulmonary infarction (HCC)    Thyroid disease    Past Surgical History:  Procedure Laterality Date   ABDOMINAL HYSTERECTOMY     Family History  Problem Relation Age of Onset   Heart failure Mother    Diabetes Mother    Kidney disease Mother    Diabetes Other    Hypertension Other    Cancer Other    Asthma Other    Colon cancer Neg Hx    Esophageal cancer Neg Hx    Rectal cancer Neg Hx    Stomach cancer Neg Hx    Pancreatic cancer Neg Hx     Social History   Tobacco Use   Smoking status: Never   Smokeless tobacco: Never  Substance Use Topics   Alcohol use: No   Marital Status: Married  ROS  Review of Systems  Cardiovascular:  Positive for irregular heartbeat and  palpitations.   Objective  Blood pressure (!) 178/97, pulse 89, height 5' 5.5" (1.664 m), weight 169 lb 6.4 oz (76.8 kg), SpO2 99 %. Body mass index is 27.76 kg/m.     12/16/2021    1:55 PM 12/16/2021    1:46 PM 11/16/2021    4:30 PM  Vitals with BMI  Height  5' 5.5"   Weight  169 lbs 6 oz   BMI  27.75   Systolic 178 176 841  Diastolic 97 99 90  Pulse 89 89 77     Physical Exam Vitals reviewed.  HENT:     Head: Normocephalic and atraumatic.  Cardiovascular:     Rate and Rhythm: Normal rate and regular rhythm.     Pulses: Normal pulses.     Heart sounds: Normal heart sounds. No murmur heard. Pulmonary:     Effort: Pulmonary effort is normal.     Breath sounds: Normal breath sounds.  Abdominal:     General: Bowel sounds are normal.  Musculoskeletal:     Right lower leg: No edema.     Left lower leg: No edema.  Skin:    General: Skin is warm and dry.  Neurological:     Mental  Status: She is alert.     Medications and allergies   Allergies  Allergen Reactions   Shellfish Allergy Anaphylaxis     Medication list after today's encounter   Current Outpatient Medications:    albuterol (VENTOLIN HFA) 108 (90 Base) MCG/ACT inhaler, Inhale 1-2 puffs into the lungs every 6 (six) hours as needed for wheezing or shortness of breath., Disp: 1 each, Rfl: 0   clonazePAM (KLONOPIN) 0.5 MG tablet, Take 0.5 mg by mouth 3 (three) times daily., Disp: , Rfl:    lisinopril (ZESTRIL) 20 MG tablet, Take 1 tablet (20 mg total) by mouth at bedtime., Disp: 30 tablet, Rfl: 6   metoprolol succinate (TOPROL-XL) 50 MG 24 hr tablet, Take 1 tablet (50 mg total) by mouth daily. Take with or immediately following a meal., Disp: 90 tablet, Rfl: 3   Multiple Vitamin (MULTIVITAMIN) tablet, Take 1 tablet by mouth daily., Disp: , Rfl:    rosuvastatin (CRESTOR) 20 MG tablet, Take 20 mg by mouth daily., Disp: , Rfl:   Laboratory examination:   Lab Results  Component Value Date   NA 143 11/16/2021   K  4.7 11/16/2021   CO2 30 11/16/2021   GLUCOSE 160 (H) 11/16/2021   BUN 12 11/16/2021   CREATININE 0.89 11/16/2021   CALCIUM 9.8 11/16/2021   GFRNONAA >60 11/16/2021       Latest Ref Rng & Units 11/16/2021    2:33 PM 03/30/2018    3:41 PM 02/14/2014   10:20 AM  CMP  Glucose 70 - 99 mg/dL 109  604  540   BUN 8 - 23 mg/dL 12  18  8    Creatinine 0.44 - 1.00 mg/dL 9.81  1.91  4.78   Sodium 135 - 145 mmol/L 143  137  141   Potassium 3.5 - 5.1 mmol/L 4.7  3.5  4.3   Chloride 98 - 111 mmol/L 106  104  104   CO2 22 - 32 mmol/L 30  28  29    Calcium 8.9 - 10.3 mg/dL 9.8  8.9  9.2   Total Protein 6.0 - 8.3 g/dL   7.8   Total Bilirubin 0.3 - 1.2 mg/dL   0.4   Alkaline Phos 39 - 117 U/L   61   AST 0 - 37 U/L   17   ALT 0 - 35 U/L   12       Latest Ref Rng & Units 11/16/2021    2:33 PM 03/30/2018    3:41 PM 02/14/2014   10:20 AM  CBC  WBC 4.0 - 10.5 K/uL 7.2  6.9  5.9   Hemoglobin 12.0 - 15.0 g/dL 29.5  62.1  30.8   Hematocrit 36.0 - 46.0 % 39.6  37.5  38.9   Platelets 150 - 400 K/uL 326  318  318     Lipid Panel No results for input(s): "CHOL", "TRIG", "LDLCALC", "VLDL", "HDL", "CHOLHDL", "LDLDIRECT" in the last 8760 hours.  HEMOGLOBIN A1C No results found for: "HGBA1C", "MPG" TSH No results for input(s): "TSH" in the last 8760 hours.  External labs:     Radiology:    Cardiac Studies:   No cardiac testing within the last 10 years.     EKG:   12/16/2021: NSR, normal axis, normal R wave progression, no ischemia  Assessment     ICD-10-CM   1. Hypertension, unspecified type  I10 EKG 12-Lead    PCV ECHOCARDIOGRAM COMPLETE    PCV MYOCARDIAL PERFUSION WO LEXISCAN  2. Mixed hyperlipidemia  E78.2 PCV ECHOCARDIOGRAM COMPLETE    PCV MYOCARDIAL PERFUSION WO LEXISCAN    3. History of pulmonary embolus (PE)  Z86.711 PCV ECHOCARDIOGRAM COMPLETE    PCV MYOCARDIAL PERFUSION WO LEXISCAN       Orders Placed This Encounter  Procedures   PCV MYOCARDIAL PERFUSION WO LEXISCAN     Standing Status:   Future    Standing Expiration Date:   02/15/2022   EKG 12-Lead   PCV ECHOCARDIOGRAM COMPLETE    Standing Status:   Future    Standing Expiration Date:   12/17/2022    Meds ordered this encounter  Medications   metoprolol succinate (TOPROL-XL) 50 MG 24 hr tablet    Sig: Take 1 tablet (50 mg total) by mouth daily. Take with or immediately following a meal.    Dispense:  90 tablet    Refill:  3   lisinopril (ZESTRIL) 20 MG tablet    Sig: Take 1 tablet (20 mg total) by mouth at bedtime.    Dispense:  30 tablet    Refill:  6    Medications Discontinued During This Encounter  Medication Reason   amoxicillin-clavulanate (AUGMENTIN) 875-125 MG tablet    Alogliptin Benzoate (NESINA) 12.5 MG TABS    baclofen (LIORESAL) 10 MG tablet    glycerin adult 2 g suppository    meclizine (ANTIVERT) 12.5 MG tablet    ibuprofen (ADVIL,MOTRIN) 600 MG tablet    triamterene-hydrochlorothiazide (DYAZIDE) 37.5-25 MG capsule    metoprolol tartrate (LOPRESSOR) 25 MG tablet      Recommendations:   RASHA TUTT is a 66 y.o.  female with HTN and HLD  Hypertension, unspecified type Stop lopressor and start Toprol-XL 50mg  qDay Start Lisinopril 20mg  qHS for uncontrolled BP Encourage low-sodium diet, less than 2000 mg daily. Schedule imaging tests in office - echo and stress test. Follow-up in 2 months or sooner if needed.   Mixed hyperlipidemia Continue Crestor   History of pulmonary embolus (PE) Completed anticoagulation, no further episodes     Clotilde Dieter, DO, [facility]  12/16/2021, 2:25 PM Office: [phone] Pager: [phone]

## 2022-01-12 ENCOUNTER — Ambulatory Visit: Payer: BLUE CROSS/BLUE SHIELD

## 2022-01-12 DIAGNOSIS — E782 Mixed hyperlipidemia: Secondary | ICD-10-CM

## 2022-01-12 DIAGNOSIS — Z86711 Personal history of pulmonary embolism: Secondary | ICD-10-CM

## 2022-01-12 DIAGNOSIS — I10 Essential (primary) hypertension: Secondary | ICD-10-CM

## 2022-02-15 ENCOUNTER — Ambulatory Visit: Payer: BLUE CROSS/BLUE SHIELD | Admitting: Internal Medicine

## 2022-03-01 ENCOUNTER — Encounter: Payer: Self-pay | Admitting: Internal Medicine

## 2022-03-01 ENCOUNTER — Ambulatory Visit: Payer: BLUE CROSS/BLUE SHIELD | Admitting: Internal Medicine

## 2022-03-01 VITALS — BP 157/93 | HR 84 | Ht 65.5 in | Wt 166.0 lb

## 2022-03-01 DIAGNOSIS — E782 Mixed hyperlipidemia: Secondary | ICD-10-CM

## 2022-03-01 DIAGNOSIS — Z86711 Personal history of pulmonary embolism: Secondary | ICD-10-CM

## 2022-03-01 DIAGNOSIS — I10 Essential (primary) hypertension: Secondary | ICD-10-CM

## 2022-03-01 MED ORDER — LISINOPRIL 20 MG PO TABS: 40.0000 mg | ORAL_TABLET | Freq: Every day | ORAL | 6 refills | Status: DC

## 2022-03-01 NOTE — Progress Notes (Signed)
Primary Physician/Referring:  Leilani Able, MD  Patient ID: [name], female    DOB: 10-Jul-1955, 67 y.o.   MRN: [id number]  Chief Complaint  Patient presents with   Hypertension   Follow-up   Results   HPI:    [name]  is a 67 y.o. female with past medical history significant for hypertension, hyperlipidemia, and history of pulmonary embolus who is here for a follow-up visit. She has been doing well since the last time she was here. Her blood pressure is still elevated and she admits it has been high outside of here as well. She is tolerating her medications without side effects. She is getting ready to join [employer] and start going to the gym regularly.  She denies chest pain, shortness of breath, diaphoresis, syncope, edema, orthopnea, PND, claudication.   Past Medical History:  Diagnosis Date   Allergy    Anemia    Asthma    Cataract    Chest pain    H/O hysterectomy for benign disease    Heart murmur    Hyperlipidemia    Hypertension    PE (pulmonary embolism)    Pulmonary infarction (HCC)    Thyroid disease    Past Surgical History:  Procedure Laterality Date   ABDOMINAL HYSTERECTOMY     Family History  Problem Relation Age of Onset   Heart failure Mother    Diabetes Mother    Kidney disease Mother    Diabetes Other    Hypertension Other    Cancer Other    Asthma Other    Colon cancer Neg Hx    Esophageal cancer Neg Hx    Rectal cancer Neg Hx    Stomach cancer Neg Hx    Pancreatic cancer Neg Hx     Social History   Tobacco Use   Smoking status: Never   Smokeless tobacco: Never  Substance Use Topics   Alcohol use: No   Marital Status: Married  ROS  Review of Systems  Cardiovascular:  Negative for irregular heartbeat and palpitations.   Objective  Blood pressure (!) 157/93, pulse 84, height 5' 5.5" (1.664 m), weight 166 lb (75.3 kg), SpO2 100 %. Body mass index is 27.2 kg/m.     03/01/2022    8:38 AM 12/16/2021    1:55 PM  12/16/2021    1:46 PM  Vitals with BMI  Height 5' 5.5"  5' 5.5"  Weight 166 lbs  169 lbs 6 oz  BMI 27.19  27.75  Systolic 157 178 914  Diastolic 93 97 99  Pulse 84 89 89     Physical Exam Vitals reviewed.  HENT:     Head: Normocephalic and atraumatic.  Cardiovascular:     Rate and Rhythm: Normal rate and regular rhythm.     Pulses: Normal pulses.     Heart sounds: Normal heart sounds. No murmur heard. Pulmonary:     Effort: Pulmonary effort is normal.     Breath sounds: Normal breath sounds.  Abdominal:     General: Bowel sounds are normal.  Musculoskeletal:     Right lower leg: No edema.     Left lower leg: No edema.  Skin:    General: Skin is warm and dry.  Neurological:     Mental Status: She is alert.     Medications and allergies   Allergies  Allergen Reactions   Shellfish Allergy Anaphylaxis     Medication list after today's encounter   Current  Outpatient Medications:    albuterol (VENTOLIN HFA) 108 (90 Base) MCG/ACT inhaler, Inhale 1-2 puffs into the lungs every 6 (six) hours as needed for wheezing or shortness of breath., Disp: 1 each, Rfl: 0   clonazePAM (KLONOPIN) 0.5 MG tablet, Take 0.5 mg by mouth 3 (three) times daily., Disp: , Rfl:    lisinopril (ZESTRIL) 20 MG tablet, Take 2 tablets (40 mg total) by mouth at bedtime., Disp: 60 tablet, Rfl: 6   metoprolol succinate (TOPROL-XL) 50 MG 24 hr tablet, Take 1 tablet (50 mg total) by mouth daily. Take with or immediately following a meal., Disp: 90 tablet, Rfl: 3   Multiple Vitamin (MULTIVITAMIN) tablet, Take 1 tablet by mouth daily., Disp: , Rfl:    rosuvastatin (CRESTOR) 20 MG tablet, Take 20 mg by mouth daily., Disp: , Rfl:   Laboratory examination:   Lab Results  Component Value Date   NA 143 11/16/2021   K 4.7 11/16/2021   CO2 30 11/16/2021   GLUCOSE 160 (H) 11/16/2021   BUN 12 11/16/2021   CREATININE 0.89 11/16/2021   CALCIUM 9.8 11/16/2021   GFRNONAA >60 11/16/2021       Latest Ref Rng &  Units 11/16/2021    2:33 PM 03/30/2018    3:41 PM 02/14/2014   10:20 AM  CMP  Glucose 70 - 99 mg/dL 161  096  045   BUN 8 - 23 mg/dL 12  18  8    Creatinine 0.44 - 1.00 mg/dL 4.09  8.11  9.14   Sodium 135 - 145 mmol/L 143  137  141   Potassium 3.5 - 5.1 mmol/L 4.7  3.5  4.3   Chloride 98 - 111 mmol/L 106  104  104   CO2 22 - 32 mmol/L 30  28  29    Calcium 8.9 - 10.3 mg/dL 9.8  8.9  9.2   Total Protein 6.0 - 8.3 g/dL   7.8   Total Bilirubin 0.3 - 1.2 mg/dL   0.4   Alkaline Phos 39 - 117 U/L   61   AST 0 - 37 U/L   17   ALT 0 - 35 U/L   12       Latest Ref Rng & Units 11/16/2021    2:33 PM 03/30/2018    3:41 PM 02/14/2014   10:20 AM  CBC  WBC 4.0 - 10.5 K/uL 7.2  6.9  5.9   Hemoglobin 12.0 - 15.0 g/dL 78.2  95.6  21.3   Hematocrit 36.0 - 46.0 % 39.6  37.5  38.9   Platelets 150 - 400 K/uL 326  318  318     Lipid Panel No results for input(s): "CHOL", "TRIG", "LDLCALC", "VLDL", "HDL", "CHOLHDL", "LDLDIRECT" in the last 8760 hours.  HEMOGLOBIN A1C No results found for: "HGBA1C", "MPG" TSH No results for input(s): "TSH" in the last 8760 hours.  External labs:     Radiology:    Cardiac Studies:   Exercise Sestamibi stress test 01/12/2022: Exercise nuclear stress test was performed using Bruce protocol.   1 Day Rest and Stress images. Exercise time 3 minutes, achieved 4.64 METS, 87% APMHR.  Stress ECG nondiagnostic for ischemia due to baseline ST-T changes. Hypertensive response to exercise (rest 150/86, exercise 260/100 mmHg) Normal myocardial perfusion without convincing evidence of reversible myocardial ischemia or prior infarct. Left ventricular size normal, wall motion preserved, calculated LVEF 71%, visually hyperdynamic. No prior study for comparison. Low risk study based on perfusion images; however, reduced functional capacity for  age, hypertensive blood pressure and accelerated heart rate response to exercise.  Clinical correlation warranted.   Echocardiogram  01/12/2022:  Normal LV systolic function with visual EF 60-65%. Left ventricle cavity  is normal in size. Normal left ventricular wall thickness. Normal global  wall motion. Doppler evidence of grade I (impaired) diastolic dysfunction,  normal LAP. Calculated EF 71%.  Structurally normal mitral valve.  Mild (Grade I) mitral regurgitation.  Structurally normal tricuspid valve.  Mild tricuspid regurgitation. No  evidence of pulmonary hypertension.    EKG:   12/16/2021: NSR, normal axis, normal R wave progression, no ischemia  Assessment     ICD-10-CM   1. Essential hypertension  I10 Basic metabolic panel    2. History of pulmonary embolus (PE)  Z86.711 Basic metabolic panel    3. Mixed hyperlipidemia  E78.2 Basic metabolic panel       Orders Placed This Encounter  Procedures   Basic metabolic panel    Standing Status:   Future    Standing Expiration Date:   03/02/2023    Meds ordered this encounter  Medications   lisinopril (ZESTRIL) 20 MG tablet    Sig: Take 2 tablets (40 mg total) by mouth at bedtime.    Dispense:  60 tablet    Refill:  6    Medications Discontinued During This Encounter  Medication Reason   lisinopril (ZESTRIL) 20 MG tablet       Recommendations:   [name] is a 67 y.o.  female with HTN and HLD  Essential hypertension BP uncontrolled, patient advised to check her pressures and keep log for next visit Continue Toprol-XL 50mg  qDay, tolerating without side effects Will increase Lisinopril to 40mg  qHS for uncontrolled BP Encourage low-sodium diet, less than 2000 mg daily. Will repeat BMP in approximately 1 week On 11/16/2021 Creatinine was 0.89 Follow-up in 2 months or sooner if needed.   Mixed hyperlipidemia Continue Crestor   History of pulmonary embolus (PE) Completed anticoagulation, no further episodes     Clotilde Dieter, DO, [facility]  03/01/2022, 8:57 AM Office: [phone] Pager: [phone]

## 2022-05-07 ENCOUNTER — Other Ambulatory Visit: Payer: Self-pay

## 2022-05-12 ENCOUNTER — Telehealth: Payer: Self-pay | Admitting: Hematology and Oncology

## 2022-05-12 ENCOUNTER — Encounter: Payer: Self-pay | Admitting: Family Medicine

## 2022-05-12 ENCOUNTER — Telehealth: Payer: Self-pay | Admitting: Family Medicine

## 2022-05-12 NOTE — Telephone Encounter (Signed)
Spoke to patient to confirm upcoming morning [facility] clinic appointment on 4/10, paperwork will be sent via [place].   Gave location and time, also informed patient that the surgeon's office would be calling as well to get information from them similar to the packet that they will be receiving so make sure to do both.  Reminded patient that all providers will be coming to the clinic to see them HERE and if they had any questions to not hesitate to reach back out to myself or their navigators.

## 2022-05-12 NOTE — Telephone Encounter (Signed)
Created in error

## 2022-05-17 ENCOUNTER — Encounter: Payer: Self-pay | Admitting: *Deleted

## 2022-05-17 DIAGNOSIS — D0511 Intraductal carcinoma in situ of right breast: Secondary | ICD-10-CM | POA: Insufficient documentation

## 2022-05-18 NOTE — Progress Notes (Signed)
Radiation Oncology         [phone] ________________________________  Initial Outpatient Consultation  Name: [name] MRN: [id number]  Date: 05/19/2022  DOB: February 28, 1955  [id number], Jocelyn Lamer, MD  Abigail Miyamoto, MD   REFERRING PHYSICIAN: Abigail Miyamoto, MD  DIAGNOSIS: No diagnosis found.   Cancer Staging  No matching staging information was found for the patient.  Stage 0 (cTis (DCIS), cN0, cM0) Right Breast UIQ, Intermediate grade DCIS, ER+ / PR+ / Her2 not assessed  CHIEF COMPLAINT: Here to discuss management of right breast DCIS  HISTORY OF PRESENT ILLNESS::[name] is a 67 y.o. female who presented with breast abnormality on the following imaging: bilateral screening mammogram on the date of 04/24/22. No symptoms, if any, were reported at that time. Right breast diagnostic mammogram on 04/30/22 showed an indeterminate 8 mm irregular asymmetry in the inner right breast. Ultrasound of the right breast performed on that same date showed a 1.1 cm mass in the upper inner aspect of the right breast, located 1 cmfn. No evidence of right axillary lymphadenopathy was appreciated.   Biopsy of the 2:30 o'clock right breast asymmetry on the date of 05/07/22 showed intermediate grade DCIS measuring 4 mm in the greatest linear extent of the sample, involving a fibrotic papillary lesion.  ER status: 95% positive and PR status 90% positive, both with strong staining intensity; Her2 not assessed. No lymph nodes were examined.   ***  PREVIOUS RADIATION THERAPY: No  PAST MEDICAL HISTORY:  has a past medical history of Allergy, Anemia, Asthma, Cataract, Chest pain, H/O hysterectomy for benign disease, Heart murmur, Hyperlipidemia, Hypertension, PE (pulmonary embolism), Pulmonary infarction (HCC), and Thyroid disease.    PAST SURGICAL HISTORY: Past Surgical History:  Procedure Laterality Date   ABDOMINAL HYSTERECTOMY      FAMILY HISTORY: family history includes Asthma in an  other family member; Cancer in an other family member; Diabetes in her mother and another family member; Heart failure in her mother; Hypertension in an other family member; Kidney disease in her mother.  SOCIAL HISTORY:  reports that she has never smoked. She has never used smokeless tobacco. She reports that she does not drink alcohol and does not use drugs.  ALLERGIES: Shellfish allergy  MEDICATIONS:  Current Outpatient Medications  Medication Sig Dispense Refill   albuterol (VENTOLIN HFA) 108 (90 Base) MCG/ACT inhaler Inhale 1-2 puffs into the lungs every 6 (six) hours as needed for wheezing or shortness of breath. 1 each 0   clonazePAM (KLONOPIN) 0.5 MG tablet Take 0.5 mg by mouth 3 (three) times daily.     lisinopril (ZESTRIL) 20 MG tablet Take 2 tablets (40 mg total) by mouth at bedtime. 60 tablet 6   metoprolol succinate (TOPROL-XL) 50 MG 24 hr tablet Take 1 tablet (50 mg total) by mouth daily. Take with or immediately following a meal. 90 tablet 3   Multiple Vitamin (MULTIVITAMIN) tablet Take 1 tablet by mouth daily.     rosuvastatin (CRESTOR) 20 MG tablet Take 20 mg by mouth daily.     No current facility-administered medications for this encounter.    REVIEW OF SYSTEMS: As above in HPI.   PHYSICAL EXAM:  vitals were not taken for this visit.   General: Alert and oriented, in no acute distress HEENT: Head is normocephalic. Extraocular movements are intact. Oropharynx is clear. Neck: Neck is supple, no palpable cervical or supraclavicular lymphadenopathy. Heart: Regular in rate and rhythm with no murmurs, rubs, or gallops. Chest: Clear to auscultation  bilaterally, with no rhonchi, wheezes, or rales. Abdomen: Soft, nontender, nondistended, with no rigidity or guarding. Extremities: No cyanosis or edema. Lymphatics: see Neck Exam Skin: No concerning lesions. Musculoskeletal: symmetric strength and muscle tone throughout. Neurologic: Cranial nerves II through XII are grossly  intact. No obvious focalities. Speech is fluent. Coordination is intact. Psychiatric: Judgment and insight are intact. Affect is appropriate. Breasts: *** . No other palpable masses appreciated in the breasts or axillae *** .    ECOG = ***  0 - Asymptomatic (Fully active, able to carry on all predisease activities without restriction)  1 - Symptomatic but completely ambulatory (Restricted in physically strenuous activity but ambulatory and able to carry out work of a light or sedentary nature. For example, light housework, office work)  2 - Symptomatic, <50% in bed during the day (Ambulatory and capable of all self care but unable to carry out any work activities. Up and about more than 50% of waking hours)  3 - Symptomatic, >50% in bed, but not bedbound (Capable of only limited self-care, confined to bed or chair 50% or more of waking hours)  4 - Bedbound (Completely disabled. Cannot carry on any self-care. Totally confined to bed or chair)  5 - Death   Santiago Glad MM, Creech RH, Tormey DC, et al. [phone]). "Toxicity and response criteria of the [facility]". Am. Evlyn Clines. Oncol. 5 (6): 649-55   LABORATORY DATA:  Lab Results  Component Value Date   WBC 7.2 11/16/2021   HGB 12.9 11/16/2021   HCT 39.6 11/16/2021   MCV 75.7 (L) 11/16/2021   PLT 326 11/16/2021   CMP     Component Value Date/Time   NA 143 11/16/2021 1433   K 4.7 11/16/2021 1433   CL 106 11/16/2021 1433   CO2 30 11/16/2021 1433   GLUCOSE 160 (H) 11/16/2021 1433   BUN 12 11/16/2021 1433   CREATININE 0.89 11/16/2021 1433   CALCIUM 9.8 11/16/2021 1433   PROT 7.8 02/14/2014 1020   ALBUMIN 4.2 02/14/2014 1020   AST 17 02/14/2014 1020   ALT 12 02/14/2014 1020   ALKPHOS 61 02/14/2014 1020   BILITOT 0.4 02/14/2014 1020   GFRNONAA >60 11/16/2021 1433   GFRAA >60 03/30/2018 1541         RADIOGRAPHY: No results found.    IMPRESSION/PLAN: ***   It was a pleasure meeting the patient today. We  discussed the risks, benefits, and side effects of radiotherapy. I recommend radiotherapy to the *** to reduce her risk of locoregional recurrence by 2/3.  We discussed that radiation would take approximately *** weeks to complete and that I would give the patient a few weeks to heal following surgery before starting treatment planning. *** If chemotherapy were to be given, this would precede radiotherapy. We spoke about acute effects including skin irritation and fatigue as well as much less common late effects including internal organ injury or irritation. We spoke about the latest technology that is used to minimize the risk of late effects for patients undergoing radiotherapy to the breast or chest wall. No guarantees of treatment were given. The patient is enthusiastic about proceeding with treatment. I look forward to participating in the patient's care.  I will await her referral back to me for postoperative follow-up and eventual CT simulation/treatment planning.  On date of service, in total, I spent *** minutes on this encounter. Patient was seen in person.   __________________________________________   Lonie Peak, MD  This document serves  as a record of services personally performed by Lonie Peak, MD. It was created on her behalf by Neena Rhymes, a trained medical scribe. The creation of this record is based on the scribe's personal observations and the provider's statements to them. This document has been checked and approved by the attending provider.

## 2022-05-19 ENCOUNTER — Ambulatory Visit: Payer: BLUE CROSS/BLUE SHIELD | Admitting: Physical Therapy

## 2022-05-19 ENCOUNTER — Inpatient Hospital Stay: Payer: BLUE CROSS/BLUE SHIELD | Admitting: Licensed Clinical Social Worker

## 2022-05-19 ENCOUNTER — Inpatient Hospital Stay: Payer: BLUE CROSS/BLUE SHIELD | Attending: Hematology and Oncology | Admitting: Hematology and Oncology

## 2022-05-19 ENCOUNTER — Encounter: Payer: Self-pay | Admitting: Genetic Counselor

## 2022-05-19 ENCOUNTER — Other Ambulatory Visit: Payer: Self-pay | Admitting: Surgery

## 2022-05-19 ENCOUNTER — Inpatient Hospital Stay (HOSPITAL_BASED_OUTPATIENT_CLINIC_OR_DEPARTMENT_OTHER): Payer: BLUE CROSS/BLUE SHIELD | Admitting: Genetic Counselor

## 2022-05-19 ENCOUNTER — Encounter: Payer: Self-pay | Admitting: Hematology and Oncology

## 2022-05-19 ENCOUNTER — Inpatient Hospital Stay: Payer: BLUE CROSS/BLUE SHIELD

## 2022-05-19 ENCOUNTER — Encounter: Payer: Self-pay | Admitting: Radiation Oncology

## 2022-05-19 ENCOUNTER — Ambulatory Visit
Admission: RE | Admit: 2022-05-19 | Discharge: 2022-05-19 | Disposition: A | Discharge status: Home / Self Care | Payer: BLUE CROSS/BLUE SHIELD | Source: Ambulatory Visit | Attending: Radiation Oncology | Admitting: Radiation Oncology

## 2022-05-19 VITALS — BP 148/84 | HR 99 | Temp 97.7°F | Resp 18 | Ht 65.0 in | Wt 162.5 lb

## 2022-05-19 DIAGNOSIS — Z86718 Personal history of other venous thrombosis and embolism: Secondary | ICD-10-CM | POA: Diagnosis not present

## 2022-05-19 DIAGNOSIS — I10 Essential (primary) hypertension: Secondary | ICD-10-CM | POA: Diagnosis not present

## 2022-05-19 DIAGNOSIS — D0511 Intraductal carcinoma in situ of right breast: Secondary | ICD-10-CM

## 2022-05-19 DIAGNOSIS — Z8042 Family history of malignant neoplasm of prostate: Secondary | ICD-10-CM

## 2022-05-19 DIAGNOSIS — Z79899 Other long term (current) drug therapy: Secondary | ICD-10-CM | POA: Diagnosis not present

## 2022-05-19 DIAGNOSIS — Z803 Family history of malignant neoplasm of breast: Secondary | ICD-10-CM

## 2022-05-19 LAB — CBC WITH DIFFERENTIAL (CANCER CENTER ONLY)
Abs Immature Granulocytes: 0.01 10*3/uL (ref 0.00–0.07)
Basophils Absolute: 0 10*3/uL (ref 0.0–0.1)
Basophils Relative: 1 %
Eosinophils Absolute: 0.1 10*3/uL (ref 0.0–0.5)
Eosinophils Relative: 2 %
HCT: 37.7 % (ref 36.0–46.0)
Hemoglobin: 12.4 g/dL (ref 12.0–15.0)
Immature Granulocytes: 0 %
Lymphocytes Relative: 52 %
Lymphs Abs: 2.9 10*3/uL (ref 0.7–4.0)
MCH: 25.1 pg — ABNORMAL LOW (ref 26.0–34.0)
MCHC: 32.9 g/dL (ref 30.0–36.0)
MCV: 76.3 fL — ABNORMAL LOW (ref 80.0–100.0)
Monocytes Absolute: 0.5 10*3/uL (ref 0.1–1.0)
Monocytes Relative: 9 %
Neutro Abs: 2 10*3/uL (ref 1.7–7.7)
Neutrophils Relative %: 36 %
Platelet Count: 321 10*3/uL (ref 150–400)
RBC: 4.94 MIL/uL (ref 3.87–5.11)
RDW: 14.4 % (ref 11.5–15.5)
WBC Count: 5.5 10*3/uL (ref 4.0–10.5)
nRBC: 0 % (ref 0.0–0.2)

## 2022-05-19 LAB — CMP (CANCER CENTER ONLY)
ALT: 11 U/L (ref 0–44)
AST: 14 U/L — ABNORMAL LOW (ref 15–41)
Albumin: 4.3 g/dL (ref 3.5–5.0)
Alkaline Phosphatase: 58 U/L (ref 38–126)
Anion gap: 5 (ref 5–15)
BUN: 11 mg/dL (ref 8–23)
CO2: 30 mmol/L (ref 22–32)
Calcium: 10 mg/dL (ref 8.9–10.3)
Chloride: 107 mmol/L (ref 98–111)
Creatinine: 1.06 mg/dL — ABNORMAL HIGH (ref 0.44–1.00)
GFR, Estimated: 59 mL/min — ABNORMAL LOW (ref >60)
Glucose, Bld: 87 mg/dL (ref 70–99)
Potassium: 4.7 mmol/L (ref 3.5–5.1)
Sodium: 142 mmol/L (ref 135–145)
Total Bilirubin: 0.5 mg/dL (ref 0.3–1.2)
Total Protein: 7.3 g/dL (ref 6.5–8.1)

## 2022-05-19 NOTE — Progress Notes (Signed)
[facility] Clinical Social Work  Initial Assessment   [name] is a 67 y.o. year old female accompanied by niece . Clinical Social Work was referred by  [facility]  for assessment of psychosocial needs.   SDOH (Social Determinants of Health) assessments performed: Yes SDOH Interventions    Flowsheet Row Clinical Support from 05/19/2022 in [facility] at [facility]  SDOH Interventions   Food Insecurity Interventions Intervention Not Indicated  Housing Interventions Intervention Not Indicated  Transportation Interventions Intervention Not Indicated  Financial Strain Interventions Intervention Not Indicated       SDOH Screenings   Food Insecurity: No Food Insecurity (05/19/2022)  Housing: Low Risk  (05/19/2022)  Transportation Needs: No Transportation Needs (05/19/2022)  Depression (PHQ2-9): Low Risk  (09/13/2018)  Financial Resource Strain: Low Risk  (05/19/2022)  Tobacco Use: Low Risk  (05/19/2022)     Distress Screen completed: Yes    05/19/2022   11:36 AM  ONCBCN DISTRESS SCREENING  Screening Type Initial Screening  Distress experienced in past week (1-10) 7      Family/Social Information:  Housing Arrangement: patient lives alone Family members/support persons in your life? Family (niece, sisters- not local but will come to support), Friends, and [employer] Financial concerns: no  Employment: Working full time in [employer] for [employer]. 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 yes Patient reported stressors: Anxiety/ nervousness and Adjusting to my illness Patient enjoys  bowling, shopping, eating out, engaging with her spiritual community Current coping skills/ strengths:  Active sense of humor , Capable of independent living , Communication skills , Motivation for treatment/growth , Religious Affiliation , and Supportive family/friends     SUMMARY: Current SDOH Barriers:  No major barriers noted today  Clinical Social Work Clinical Goal(s):  No clinical social work goals at this time  Interventions: Discussed common feeling and emotions when being diagnosed with cancer, and the importance of support during treatment Informed patient of the support team roles and support services at [facility] Provided CSW contact information and encouraged patient to call with any questions or concerns   Follow Up Plan: Patient will contact CSW with any support or resource needs Patient verbalizes understanding of plan: Yes    Yahmir Sokolov E Narvel Kozub, LCSW

## 2022-05-19 NOTE — Progress Notes (Signed)
REFERRING PROVIDER: Rachel Moulds, MD [address] [place],  [place] [address]  PRIMARY PROVIDER:  Leilani Able, MD  PRIMARY REASON FOR VISIT:  1. Ductal carcinoma in situ (DCIS) of right breast   2. Family history of breast cancer   3. Family history of prostate cancer     HISTORY OF PRESENT ILLNESS:   [name], a 67 y.o. female, was seen for a [place] cancer genetics consultation at the request of Dr. Al Pimple due to a personal and family history of breast cancer.  [name] presents to clinic today to discuss the possibility of a hereditary predisposition to cancer, to discuss genetic testing, and to further clarify her future cancer risks, as well as potential cancer risks for family members.   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 The treatment plan is pending.   CANCER HISTORY:  Oncology History  Ductal carcinoma in situ (DCIS) of right breast  04/24/2022 Mammogram   Screening mammogram showed possible focal asymmetry in the right breast which is indeterminate.  Unilateral diagnostic mammogram showed 0.8 cm irregular asymmetry in the right breast.  Ultrasound confirmed the findings, size estimated to be 9 x 1.1 x 1 cm irregular mass   05/07/2022 Pathology Results   Right breast needle core biopsy showed DCIS involving a fibrotic papillary lesion, cribriform, intermediate nuclear grade, no necrosis, no calcs.  Prognostic showed ER 95% positive strong staining PR 90% positive strong staining   05/17/2022 Initial Diagnosis   Ductal carcinoma in situ (DCIS) of right breast     Past Medical History:  Diagnosis Date   Allergy    Anemia    Asthma    Breast cancer    Cataract    Chest pain    H/O hysterectomy for benign disease    Heart murmur    Hyperlipidemia    Hypertension    PE (pulmonary embolism)    Pulmonary infarction    Thyroid disease     Past Surgical History:  Procedure  Laterality Date   ABDOMINAL HYSTERECTOMY      FAMILY HISTORY:  We obtained a detailed, 4-generation family history.  Significant diagnoses are listed below: Family History  Problem Relation Age of Onset   Breast cancer Sister 51   Prostate cancer Maternal Uncle    Cancer Maternal Grandmother        unknown primary; mets; d. early 5s   Breast cancer Cousin        dx 57s-60s; paternal female cousin    [name] states that her sister with a history of breast cancer may have had genetic testing previously.    There is no reported Ashkenazi Jewish ancestry. There is no known consanguinity.  GENETIC COUNSELING ASSESSMENT: [name] is a 67 y.o. female with a personal and family history of breast cancer which is somewhat suggestive of a hereditary cancer syndrome given the presence of related cancers in the family. We, therefore, discussed and recommended the following at today's visit.   DISCUSSION: We discussed that 5 - 10% of cancer is hereditary.  Most cases of hereditary breast cancer are associated with mutations in BRCA1/2.  There are other genes that can be associated with hereditary breast cancer syndromes.  We discussed that testing is beneficial for several reasons including knowing how to follow individuals for their cancer risks and understanding if other family members could be at risk for cancer and  allowing them to undergo genetic testing.   We reviewed the characteristics, features and inheritance patterns of hereditary cancer syndromes. We also discussed genetic testing, including the appropriate family members to test, the process of testing, insurance coverage and turn-around-time for results. We discussed the implications of a negative, positive, carrier and/or variant of uncertain significant result. We recommended [name] pursue genetic testing for a panel that includes genes associated with breast cancer and other cancers.    The [facility] Common Hereditary Cancers +  RNA Panel includes sequencing, deletion/duplication, and RNA analysis of the following 48 genes: APC, ATM, AXIN2, BAP1, BARD1, BMPR1A, BRCA1, BRCA2, BRIP1, CDH1, CDK4*, CDKN2A*, CHEK2, CTNNA1, DICER1, EPCAM* (del/dup only), FH, GREM1* (promoter dup analysis only), HOXB13*, KIT*, MBD4*, MEN1, MLH1, MSH2, MSH3, MSH6, MUTYH, NF1, NTHL1, PALB2, PDGFRA*, PMS2, POLD1, POLE, PTEN, RAD51C, RAD51D, SDHA (sequencing only), SDHB, SDHC, SDHD, SMAD4, SMARCA4, STK11, TP53, TSC1, TSC2, VHL.  *Genes without RNA analysis.   Based on [name]'s personal and family history of breast cancer, she meets medical criteria for genetic testing. Despite that she meets criteria, she may still have an out of pocket cost. We discussed that if her out of pocket cost for testing is over $100, the laboratory should contact her and discuss the self-pay prices and/or patient pay assistance programs.    PLAN: After considering the risks, benefits, and limitations, [name] provided informed consent to pursue genetic testing and the blood sample was sent to [facility] for analysis of the Common Hereditary Cancers +RNA Panel. Results should be available within approximately 3 weeks' time, at which point they will be disclosed by telephone to [name], as will any additional recommendations warranted by these results. [name] will receive a summary of her genetic counseling visit and a copy of her results once available. This information will also be available in Epic.    [name] questions were answered to her satisfaction today. Our contact information was provided should additional questions or concerns arise. Thank you for the referral and allowing [place] to share in the care of your patient.   Syna Gad M. Rennie Plowman, MS, [facility] Genetic Counselor [email or web address] (P) [phone]  The patient was seen for a total of 15 minutes in face-to-face genetic counseling.  She was accompanied by her niece. Dr. Al Pimple was  available to discuss this case as needed.    _______________________________________________________________________ For Office Staff:  Number of people involved in session: 2 Was an Intern/ student involved with case: no

## 2022-05-19 NOTE — Assessment & Plan Note (Signed)
This is a very pleasant 67 year old postmenopausal female patient with newly diagnosed right breast DCIS, grade 2, ER/PR positive referred to breast [facility] for additional recommendations.  We have discussed the following details about DCIS.  Pathology review: I discussed with the patient the difference between DCIS and invasive breast cancer. It is considered a precancerous lesion. DCIS is classified as a Stage 0 breast cancer. It is generally detected through mammograms as calcifications. We discussed the significance of grades and its impact on prognosis. We also discussed the importance of ER and PR receptors and their implications to adjuvant treatment options. Prognosis of DCIS dependence on grade and degree of comedo necrosis. It is anticipated that if not treated, 20-30% of DCIS can develop into invasive breast cancer.  Recommendation: 1. Breast conserving surgery 2. Followed by adjuvant radiation therapy 3. Followed by antiestrogen therapy with tamoxifen/aromatase inhibitors based on menopausal status 5 years  Tamoxifen counseling: We discussed the risks and benefits of tamoxifen. These include but not limited to insomnia, hot flashes, mood changes, vaginal dryness, and weight gain. Although rare, serious side effects including endometrial cancer, risk of blood clots were also discussed. We strongly believe that the benefits far outweigh the risks. Patient understands these risks and consented to starting treatment. Planned treatment duration is 5 years.  Aromatase inhibitors counseling: We have discussed the mechanism of action of aromatase inhibitors today.  We have discussed adverse effects including but not limited to menopausal symptoms, increased risk of osteoporosis and fractures, cardiovascular events, arthralgias and myalgias.  We do believe that the benefits far outweigh the risks.  Plan treatment duration of 5 years.  Given her history of DVT in the past after hysterectomy, she is  leaning towards aromatase inhibitors.  She will return to clinic after radiation to initiate antiestrogen therapy.  Will order a baseline bone density, she never had one.  Thank you for consulting [place] in the care of this patient.  Please do not hesitate to contact us with any additional questions or concerns.

## 2022-05-19 NOTE — Progress Notes (Signed)
[facility] CONSULT NOTE  Patient Care Team: Leilani Able, MD as PCP - General (Family Medicine) Donnelly Angelica, RN as Oncology Nurse Navigator Lu Duffel, Margretta Ditty, RN as Oncology Nurse Navigator Rachel Moulds, MD as Consulting Physician (Hematology and Oncology) Abigail Miyamoto, MD as Consulting Physician (General Surgery) Lonie Peak, MD as Attending Physician (Radiation Oncology)  CHIEF COMPLAINTS/PURPOSE OF CONSULTATION:  Newly diagnosed breast cancer  HISTORY OF PRESENTING ILLNESS:  [name] 67 y.o. female is here because of recent diagnosis of right   I reviewed her records extensively and collaborated the history with the patient.  SUMMARY OF ONCOLOGIC HISTORY: Oncology History  Ductal carcinoma in situ (DCIS) of right breast  04/24/2022 Mammogram   Screening mammogram showed possible focal asymmetry in the right breast which is indeterminate.  Unilateral diagnostic mammogram showed 0.8 cm irregular asymmetry in the right breast.  Ultrasound confirmed the findings, size estimated to be 9 x 1.1 x 1 cm irregular mass   05/07/2022 Pathology Results   Right breast needle core biopsy showed DCIS involving a fibrotic papillary lesion, cribriform, intermediate nuclear grade, no necrosis, no calcs.  Prognostic showed ER 95% positive strong staining PR 90% positive strong staining   05/17/2022 Initial Diagnosis   Ductal carcinoma in situ (DCIS) of right breast      MEDICAL HISTORY:  Past Medical History:  Diagnosis Date   Allergy    Anemia    Asthma    Breast cancer    Cataract    Chest pain    H/O hysterectomy for benign disease    Heart murmur    Hyperlipidemia    Hypertension    PE (pulmonary embolism)    Pulmonary infarction    Thyroid disease     SURGICAL HISTORY: Past Surgical History:  Procedure Laterality Date   ABDOMINAL HYSTERECTOMY      SOCIAL HISTORY: Social History   Socioeconomic History   Marital status: Divorced     Spouse name: Not on file   Number of children: Not on file   Years of education: Not on file   Highest education level: Not on file  Occupational History   Not on file  Tobacco Use   Smoking status: Never   Smokeless tobacco: Never  Vaping Use   Vaping Use: Never used  Substance and Sexual Activity   Alcohol use: No   Drug use: No   Sexual activity: Not on file  Other Topics Concern   Not on file  Social History Narrative   Not on file   Social Determinants of Health   Financial Resource Strain: Not on file  Food Insecurity: Not on file  Transportation Needs: Not on file  Physical Activity: Not on file  Stress: Not on file  Social Connections: Not on file  Intimate Partner Violence: Not on file    FAMILY HISTORY: Family History  Problem Relation Age of Onset   Heart failure Mother    Diabetes Mother    Kidney disease Mother    Breast cancer Sister 9   Prostate cancer Maternal Uncle    Cancer Maternal Grandmother        unknown primary; mets; d. early 29s   Diabetes Other    Hypertension Other    Cancer Other    Asthma Other    Breast cancer Cousin        dx 58s-60s; paternal female cousin   Colon cancer Neg Hx    Esophageal cancer Neg Hx  Rectal cancer Neg Hx    Stomach cancer Neg Hx    Pancreatic cancer Neg Hx     ALLERGIES:  is allergic to shellfish allergy.  MEDICATIONS:  Current Outpatient Medications  Medication Sig Dispense Refill   albuterol (VENTOLIN HFA) 108 (90 Base) MCG/ACT inhaler Inhale 1-2 puffs into the lungs every 6 (six) hours as needed for wheezing or shortness of breath. 1 each 0   clonazePAM (KLONOPIN) 0.5 MG tablet Take 0.5 mg by mouth 3 (three) times daily.     lisinopril (ZESTRIL) 20 MG tablet Take 2 tablets (40 mg total) by mouth at bedtime. 60 tablet 6   metoprolol succinate (TOPROL-XL) 50 MG 24 hr tablet Take 1 tablet (50 mg total) by mouth daily. Take with or immediately following a meal. 90 tablet 3   Multiple Vitamin  (MULTIVITAMIN) tablet Take 1 tablet by mouth daily.     rosuvastatin (CRESTOR) 20 MG tablet Take 20 mg by mouth daily.     No current facility-administered medications for this visit.    REVIEW OF SYSTEMS:   Constitutional: Denies fevers, chills or abnormal night sweats Eyes: Denies blurriness of vision, double vision or watery eyes Ears, nose, mouth, throat, and face: Denies mucositis or sore throat Respiratory: Denies cough, dyspnea or wheezes Cardiovascular: Denies palpitation, chest discomfort or lower extremity swelling Gastrointestinal:  Denies nausea, heartburn or change in bowel habits Skin: Denies abnormal skin rashes Lymphatics: Denies new lymphadenopathy or easy bruising Neurological:Denies numbness, tingling or new weaknesses Behavioral/Psych: Mood is stable, no new changes  Breast:  Denies any palpable lumps or discharge All other systems were reviewed with the patient and are negative.  PHYSICAL EXAMINATION: ECOG PERFORMANCE STATUS: 0 - Asymptomatic  Vitals:   05/19/22 0852  BP: (!) 148/84  Pulse: 99  Resp: 18  Temp: 97.7 F (36.5 C)  SpO2: 100%   Filed Weights   05/19/22 0852  Weight: 162 lb 8 oz (73.7 kg)    GENERAL:alert, no distress and comfortable SKIN: skin color, texture, turgor are normal, no rashes or significant lesions EYES: normal, conjunctiva are pink and non-injected, sclera clear OROPHARYNX:no exudate, no erythema and lips, buccal mucosa, and tongue normal  NECK: supple, thyroid normal size, non-tender, without nodularity LYMPH:  no palpable lymphadenopathy in the cervical, axillary or inguinal LUNGS: clear to auscultation and percussion with normal breathing effort HEART: regular rate & rhythm and no murmurs and no lower extremity edema ABDOMEN:abdomen soft, non-tender and normal bowel sounds Musculoskeletal:no cyanosis of digits and no clubbing  PSYCH: alert & oriented x 3 with fluent speech NEURO: no focal motor/sensory deficits BREAST:  Postbiopsy changes noted.  No palpable masses or regional adenopathy  LABORATORY DATA:  I have reviewed the data as listed Lab Results  Component Value Date   WBC 5.5 05/19/2022   HGB 12.4 05/19/2022   HCT 37.7 05/19/2022   MCV 76.3 (L) 05/19/2022   PLT 321 05/19/2022   Lab Results  Component Value Date   NA 142 05/19/2022   K 4.7 05/19/2022   CL 107 05/19/2022   CO2 30 05/19/2022    RADIOGRAPHIC STUDIES: I have personally reviewed the radiological reports and agreed with the findings in the report.  ASSESSMENT AND PLAN:  Ductal carcinoma in situ (DCIS) of right breast This is a very pleasant 67 year old postmenopausal female patient with newly diagnosed right breast DCIS, grade 2, ER/PR positive referred to breast MDC for additional recommendations.  We have discussed the following details about DCIS.  Pathology review:  I discussed with the patient the difference between DCIS and invasive breast cancer. It is considered a precancerous lesion. DCIS is classified as a Stage 0 breast cancer. It is generally detected through mammograms as calcifications. We discussed the significance of grades and its impact on prognosis. We also discussed the importance of ER and PR receptors and their implications to adjuvant treatment options. Prognosis of DCIS dependence on grade and degree of comedo necrosis. It is anticipated that if not treated, 20-30% of DCIS can develop into invasive breast cancer.  Recommendation: 1. Breast conserving surgery 2. Followed by adjuvant radiation therapy 3. Followed by antiestrogen therapy with tamoxifen/aromatase inhibitors based on menopausal status 5 years  Tamoxifen counseling: We discussed the risks and benefits of tamoxifen. These include but not limited to insomnia, hot flashes, mood changes, vaginal dryness, and weight gain. Although rare, serious side effects including endometrial cancer, risk of blood clots were also discussed. We strongly believe that  the benefits far outweigh the risks. Patient understands these risks and consented to starting treatment. Planned treatment duration is 5 years.  Aromatase inhibitors counseling: We have discussed the mechanism of action of aromatase inhibitors today.  We have discussed adverse effects including but not limited to menopausal symptoms, increased risk of osteoporosis and fractures, cardiovascular events, arthralgias and myalgias.  We do believe that the benefits far outweigh the risks.  Plan treatment duration of 5 years.  Given her history of DVT in the past after hysterectomy, she is leaning towards aromatase inhibitors.  She will return to clinic after radiation to initiate antiestrogen therapy.  Will order a baseline bone density, she never had one.  Thank you for consulting [place] in the care of this patient.  Please do not hesitate to contact us with any additional questions or concerns.  Total time spent 60 minutes including history, physical exam, review of records, counseling and coordination of care All questions were answered. The patient knows to call the clinic with any problems, questions or concerns.    Rachel Moulds, MD 05/19/22

## 2022-05-20 ENCOUNTER — Other Ambulatory Visit: Payer: Self-pay | Admitting: *Deleted

## 2022-05-20 ENCOUNTER — Telehealth: Payer: Self-pay | Admitting: Radiation Oncology

## 2022-05-20 DIAGNOSIS — D0511 Intraductal carcinoma in situ of right breast: Secondary | ICD-10-CM

## 2022-05-20 LAB — GENETIC SCREENING ORDER

## 2022-05-20 NOTE — Telephone Encounter (Signed)
Called patient to schedule a consultation w. Dr. Squire. No answer, LVM for a return call.  

## 2022-05-25 ENCOUNTER — Encounter: Payer: Self-pay | Admitting: *Deleted

## 2022-05-25 ENCOUNTER — Telehealth: Payer: Self-pay | Admitting: *Deleted

## 2022-05-25 NOTE — Telephone Encounter (Signed)
Spoke to pt concerning [facility] from 4.10.24. Denies questions or concerns regarding dx or treatment care plan. Encourage pt to call with needs. Received verbal understanding. 

## 2022-05-26 NOTE — Progress Notes (Signed)
Surgical Instructions    Your procedure is scheduled on Tuesday April 23rd.  Report to [facility] Main Entrance "A" at 6:30 A.M., then check in with the Admitting office.  Call this number if you have problems the morning of surgery:  [phone]   If you have any questions prior to your surgery date call [phone]: Open Monday-Friday 8am-4pm If you experience any cold or flu symptoms such as cough, fever, chills, shortness of breath, etc. between now and your scheduled surgery, please notify us at the above number     Remember:  Do not eat after midnight the night before your surgery  You may drink clear liquids until 5:30am the morning of your surgery.   Clear liquids allowed are: Water, Non-Citrus Juices (without pulp), Carbonated Beverages, Clear Tea, Black Coffee ONLY (NO MILK, CREAM OR POWDERED CREAMER of any kind), and Gatorade   Enhanced Recovery after Surgery for Orthopedics Enhanced Recovery after Surgery is a protocol used to improve the stress on your body and your recovery after surgery.  Patient Instructions  The day of surgery (if you do NOT have diabetes):  Drink ONE (1) Pre-Surgery Clear Ensure by __5:30___ am the morning of surgery   This drink was given to you during your hospital  pre-op appointment visit. Nothing else to drink after completing the  Pre-Surgery Clear Ensure.          If you have questions, please contact your surgeon's office.     Take these medicines the morning of surgery with A SIP OF WATER: metoprolol succinate (TOPROL-XL) 50 MG 24 hr tablet   IF NEEDED  carboxymethylcellulose (REFRESH PLUS) 0.5 % SOLN    As of today, STOP taking any Aspirin (unless otherwise instructed by your surgeon) Aleve, Naproxen, Ibuprofen, Motrin, Advil, Goody's, BC's, all herbal medications, fish oil, and all vitamins.           Do not wear jewelry or makeup. Do not wear lotions, powders, perfumes or deodorant. Do not shave 48 hours prior to surgery.    Do not bring valuables to the hospital. Do not wear nail polish, gel polish, artificial nails, or any other type of covering on natural nails (fingers and toes) If you have artificial nails or gel coating that need to be removed by a nail salon, please have this removed prior to surgery. Artificial nails or gel coating may interfere with anesthesia's ability to adequately monitor your vital signs.  [facility] is not responsible for any belongings or valuables.    Do NOT Smoke (Tobacco/Vaping)  24 hours prior to your procedure  If you use a CPAP at night, you may bring your mask for your overnight stay.   Contacts, glasses, hearing aids, dentures or partials may not be worn into surgery, please bring cases for these belongings   For patients admitted to the hospital, discharge time will be determined by your treatment team.   Patients discharged the day of surgery will not be allowed to drive home, and someone needs to stay with them for 24 hours.   SURGICAL WAITING ROOM VISITATION Patients having surgery or a procedure may have no more than 2 support people in the waiting area - these visitors may rotate.   Children under the age of 34 must have an adult with them who is not the patient. If the patient needs to stay at the hospital during part of their recovery, the visitor guidelines for inpatient rooms apply. Pre-op nurse will coordinate an appropriate time  for 1 support person to accompany patient in pre-op.  This support person may not rotate.   Please refer to [email or web address] for the visitor guidelines for Inpatients (after your surgery is over and you are in a regular room).    Special instructions:    Oral Hygiene is also important to reduce your risk of infection.  Remember - BRUSH YOUR TEETH THE MORNING OF SURGERY WITH YOUR REGULAR TOOTHPASTE   [facility]- Preparing For Surgery  Before surgery, you can play an  important role. Because skin is not sterile, your skin needs to be as free of germs as possible. You can reduce the number of germs on your skin by washing with CHG (chlorahexidine gluconate) Soap before surgery.  CHG is an antiseptic cleaner which kills germs and bonds with the skin to continue killing germs even after washing.     Please do not use if you have an allergy to CHG or antibacterial soaps. If your skin becomes reddened/irritated stop using the CHG.  Do not shave (including legs and underarms) for at least 48 hours prior to first CHG shower. It is OK to shave your face.  Please follow these instructions carefully.     Shower the NIGHT BEFORE SURGERY and the MORNING OF SURGERY with CHG Soap.   If you chose to wash your hair, wash your hair first as usual with your normal shampoo. After you shampoo, rinse your hair and body thoroughly to remove the shampoo.  Then [employer] and genitals (private parts) with your normal soap and rinse thoroughly to remove soap.  After that Use CHG Soap as you would any other liquid soap. You can apply CHG directly to the skin and wash gently with a scrungie or a clean washcloth.   Apply the CHG Soap to your body ONLY FROM THE NECK DOWN.  Do not use on open wounds or open sores. Avoid contact with your eyes, ears, mouth and genitals (private parts). Wash Face and genitals (private parts)  with your normal soap.   Wash thoroughly, paying special attention to the area where your surgery will be performed.  Thoroughly rinse your body with warm water from the neck down.  DO NOT shower/wash with your normal soap after using and rinsing off the CHG Soap.  Pat yourself dry with a CLEAN TOWEL.  Wear CLEAN PAJAMAS to bed the night before surgery  Place CLEAN SHEETS on your bed the night before your surgery  DO NOT SLEEP WITH PETS.   Day of Surgery:  Take a shower with CHG soap. Wear Clean/Comfortable clothing the morning of surgery Do not apply any  deodorants/lotions.   Remember to brush your teeth WITH YOUR REGULAR TOOTHPASTE.    If you received a COVID test during your pre-op visit, it is requested that you wear a mask when out in public, stay away from anyone that may not be feeling well, and notify your surgeon if you develop symptoms. If you have been in contact with anyone that has tested positive in the last 10 days, please notify your surgeon.    Please read over the following fact sheets that you were given.

## 2022-05-27 ENCOUNTER — Inpatient Hospital Stay
Admission: RE | Admit: 2022-05-27 | Discharge: 2022-05-27 | Disposition: A | Discharge status: Home / Self Care | Payer: Self-pay | Source: Ambulatory Visit | Attending: Radiation Oncology | Admitting: Radiation Oncology

## 2022-05-27 ENCOUNTER — Encounter (HOSPITAL_COMMUNITY): Payer: Self-pay

## 2022-05-27 ENCOUNTER — Other Ambulatory Visit: Payer: Self-pay | Admitting: Radiation Oncology

## 2022-05-27 ENCOUNTER — Encounter (HOSPITAL_COMMUNITY)
Admission: RE | Admit: 2022-05-27 | Discharge: 2022-05-27 | Disposition: A | Discharge status: Home / Self Care | Payer: BLUE CROSS/BLUE SHIELD | Source: Ambulatory Visit | Attending: Surgery | Admitting: Surgery

## 2022-05-27 ENCOUNTER — Other Ambulatory Visit: Payer: Self-pay

## 2022-05-27 DIAGNOSIS — D0511 Intraductal carcinoma in situ of right breast: Secondary | ICD-10-CM

## 2022-05-27 DIAGNOSIS — Z01818 Encounter for other preprocedural examination: Secondary | ICD-10-CM | POA: Insufficient documentation

## 2022-05-27 HISTORY — DX: Prediabetes: R73.03

## 2022-05-27 NOTE — Progress Notes (Signed)
PCP - Leilani Able Cardiologist - Clotilde Dieter, DO Oncologist: Rachel Moulds   PPM/ICD - denies  Chest x-ray - n/a EKG - 12/16/21 Stress Test - 01/12/22- blood pressure was high and they were ruling out underlying causes, test result was normal per patient ECHO - 01/13/22 Cardiac Cath - denies  Sleep Study - denies   ERAS Protcol -yes PRE-SURGERY Ensure or G2- ensure given  COVID TEST- not needed   Anesthesia review: no  Patient denies shortness of breath, fever, cough and chest pain at PAT appointment   All instructions explained to the patient, with a verbal understanding of the material. Patient agrees to go over the instructions while at home for a better understanding. Patient also instructed to self quarantine after being tested for COVID-19. The opportunity to ask questions was provided.

## 2022-05-31 ENCOUNTER — Ambulatory Visit: Payer: BLUE CROSS/BLUE SHIELD | Admitting: Internal Medicine

## 2022-05-31 NOTE — Progress Notes (Signed)
left voicemail for patient and sister with new arrival time of 0600 and clear liquids okay until 0500 tomorrow

## 2022-05-31 NOTE — H&P (Signed)
REFERRING PHYSICIAN: Lucia Bitter* PROVIDER: Wayne Both, MD MRN: [id number] DOB: 1956-01-06 DATE OF ENCOUNTER: 05/19/2022 Subjective  Chief Complaint: Breast Cancer  History of Present Illness: [name] is a 67 y.o. female who is seen today as an office consultation for evaluation of Breast Cancer  This is a 67 year old female who was found on recent screening mammography to have a mass in her right breast. She underwent further imaging and an ultrasound showing a 1.1 cm mass at the 2:30 position 1 cm from the nipple. Her axilla was unremarkable. 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 It was 95% ER positive and 90% PR positive. Is a family history of breast cancer in her sister at age 41. She currently has hypertension. She has a history of a pulmonary embolism in 2013 that occurred after an open hysterectomy. She is no longer on blood thinning medication. She has had no previous problems regarding her breast.  Review of Systems: A complete review of systems was obtained from the patient. I have reviewed this information and discussed as appropriate with the patient. See HPI as well for other ROS.  ROS  Medical History: Past Medical History: Diagnosis Date Anxiety Asthma, unspecified asthma severity, unspecified whether complicated, unspecified whether persistent (HHS-HCC) DVT (deep venous thrombosis) (CMS/HHS-HCC) History of cancer Hyperlipidemia Hypertension Pulmonary hypertension (CMS/HHS-HCC) Thyroid disease  There is no problem list on file for this patient.  Past Surgical History: Procedure Laterality Date HYSTERECTOMY   Allergies Allergen Reactions Shellfish Containing Products Anaphylaxis  Current Outpatient Medications on File Prior to Visit Medication Sig Dispense Refill albuterol MDI, PROVENTIL, VENTOLIN, PROAIR, HFA 90 mcg/actuation inhaler Inhale into the lungs clonazePAM (KLONOPIN) 0.5  MG tablet Take 0.5 mg by mouth 3 (three) times daily lisinopriL (ZESTRIL) 20 MG tablet Take 20 mg by mouth at bedtime metoprolol succinate (TOPROL-XL) 50 MG XL tablet Take by mouth multivitamin tablet Take 1 tablet by mouth once daily rosuvastatin (CRESTOR) 20 MG tablet Take 20 mg by mouth once daily  No current facility-administered medications on file prior to visit.  Family History Problem Relation Age of Onset High blood pressure (Hypertension) Mother Hyperlipidemia (Elevated cholesterol) Mother Diabetes Mother Diabetes Father Hyperlipidemia (Elevated cholesterol) Father High blood pressure (Hypertension) Sister Hyperlipidemia (Elevated cholesterol) Sister Breast cancer Sister   Social History  Tobacco Use Smoking Status Never Smokeless Tobacco Never   Social History  Socioeconomic History Marital status: Married Tobacco Use Smoking status: Never Smokeless tobacco: Never Substance and Sexual Activity Alcohol use: Not Currently Drug use: Not Currently  Objective: There were no vitals filed for this visit. There is no height or weight on file to calculate BMI.  Physical Exam  She appears well on exam  Breasts are normal in appearance bilaterally. The nipple areolar complexes are normal.  There is no axillary adenopathy  Labs, Imaging and Diagnostic Testing: I have reviewed her mammograms, ultrasound, and pathology results  Assessment and Plan:  Right breast ductal carcinoma in situ  I had a discussion with the patient and her niece regarding her diagnosis. We also discussed her and her multidisciplinary breast cancer conference. From a surgical standpoint we next discussed surgery for breast cancer. We discussed both breast conservation and mastectomy. She is interested in breast conservation. I next discussed proceeding with a radioactive seed guided right breast lumpectomy. I explained the surgical procedure in detail. We discussed the risk which includes  but is not limited to bleeding, infection, the need for further  surgery if margins are positive, cardiopulmonary issues, DVT, postoperative recovery, etc. She understands and wished to proceed with surgery which will be scheduled.

## 2022-05-31 NOTE — Anesthesia Preprocedure Evaluation (Addendum)
Anesthesia Evaluation  Patient identified by MRN, date of birth, ID band Patient awake    Reviewed: Allergy & Precautions, NPO status , Patient's Chart, lab work & pertinent test results, reviewed documented beta blocker date and time   History of Anesthesia Complications Negative for: history of anesthetic complications  Airway Mallampati: II  TM Distance: >3 FB Neck ROM: Full    Dental  (+) Dental Advisory Given, Chipped   Pulmonary asthma , PE   Pulmonary exam normal        Cardiovascular hypertension, Pt. on home beta blockers and Pt. on medications Normal cardiovascular exam   '23 Myoperfusion - low risk study  '23 TTE - EF 60-65%. Doppler evidence of grade I (impaired) diastolic dysfunction, normal LAP. Mild (Grade I) mitral regurgitation. Mild tricuspid regurgitation.      Neuro/Psych negative neurological ROS  negative psych ROS   GI/Hepatic negative GI ROS, Neg liver ROS,,,  Endo/Other   Pre-DM   Renal/GU negative Renal ROS     Musculoskeletal negative musculoskeletal ROS (+)    Abdominal   Peds  Hematology negative hematology ROS (+)   Anesthesia Other Findings   Reproductive/Obstetrics  Breast cancer                              Anesthesia Physical Anesthesia Plan  ASA: 2  Anesthesia Plan: General   Post-op Pain Management: Tylenol PO (pre-op)*   Induction: Intravenous  PONV Risk Score and Plan: 3 and Treatment may vary due to age or medical condition, Ondansetron, Dexamethasone, Midazolam and Scopolamine patch - Pre-op  Airway Management Planned: LMA  Additional Equipment: None  Intra-op Plan:   Post-operative Plan: Extubation in OR  Informed Consent: I have reviewed the patients History and Physical, chart, labs and discussed the procedure including the risks, benefits and alternatives for the proposed anesthesia with the patient or authorized  representative who has indicated his/her understanding and acceptance.     Dental advisory given  Plan Discussed with: CRNA and Anesthesiologist  Anesthesia Plan Comments:         Anesthesia Quick Evaluation

## 2022-06-01 ENCOUNTER — Encounter (HOSPITAL_COMMUNITY): Payer: Self-pay | Admitting: Surgery

## 2022-06-01 ENCOUNTER — Ambulatory Visit (HOSPITAL_COMMUNITY)
Admission: RE | Admit: 2022-06-01 | Discharge: 2022-06-01 | Disposition: A | Discharge status: Home / Self Care | Payer: BLUE CROSS/BLUE SHIELD | Attending: Surgery | Admitting: Surgery

## 2022-06-01 ENCOUNTER — Ambulatory Visit (HOSPITAL_COMMUNITY): Payer: BLUE CROSS/BLUE SHIELD | Admitting: Anesthesiology

## 2022-06-01 ENCOUNTER — Encounter (HOSPITAL_COMMUNITY)
Admission: RE | Disposition: A | Discharge status: Home / Self Care | Payer: Self-pay | Source: Home / Self Care | Attending: Surgery

## 2022-06-01 ENCOUNTER — Other Ambulatory Visit: Payer: Self-pay

## 2022-06-01 DIAGNOSIS — Z170 Estrogen receptor positive status [ER+]: Secondary | ICD-10-CM | POA: Diagnosis not present

## 2022-06-01 DIAGNOSIS — Z86711 Personal history of pulmonary embolism: Secondary | ICD-10-CM | POA: Insufficient documentation

## 2022-06-01 DIAGNOSIS — D0511 Intraductal carcinoma in situ of right breast: Secondary | ICD-10-CM | POA: Diagnosis present

## 2022-06-01 DIAGNOSIS — N6489 Other specified disorders of breast: Secondary | ICD-10-CM | POA: Insufficient documentation

## 2022-06-01 DIAGNOSIS — I10 Essential (primary) hypertension: Secondary | ICD-10-CM | POA: Insufficient documentation

## 2022-06-01 DIAGNOSIS — J45909 Unspecified asthma, uncomplicated: Secondary | ICD-10-CM | POA: Diagnosis not present

## 2022-06-01 DIAGNOSIS — Z79899 Other long term (current) drug therapy: Secondary | ICD-10-CM | POA: Insufficient documentation

## 2022-06-01 DIAGNOSIS — Z803 Family history of malignant neoplasm of breast: Secondary | ICD-10-CM | POA: Diagnosis not present

## 2022-06-01 HISTORY — PX: BREAST LUMPECTOMY WITH RADIOACTIVE SEED LOCALIZATION: SHX6424

## 2022-06-01 SURGERY — BREAST LUMPECTOMY WITH RADIOACTIVE SEED LOCALIZATION
Anesthesia: General | Site: Breast | Laterality: Right

## 2022-06-01 MED ORDER — CHLORHEXIDINE GLUCONATE CLOTH 2 % EX PADS: 6.0000 | MEDICATED_PAD | Freq: Once | CUTANEOUS | Status: DC

## 2022-06-01 MED ORDER — FENTANYL CITRATE (PF) 100 MCG/2ML IJ SOLN: 25.0000 ug | INTRAMUSCULAR | Status: DC | PRN

## 2022-06-01 MED ORDER — PROMETHAZINE HCL 25 MG/ML IJ SOLN: 6.2500 mg | INTRAMUSCULAR | Status: DC | PRN

## 2022-06-01 MED ORDER — SCOPOLAMINE 1 MG/3DAYS TD PT72
1.0000 | MEDICATED_PATCH | TRANSDERMAL | Status: DC
  Administered 2022-06-01: 1.5 mg via TRANSDERMAL
  Filled 2022-06-01: qty 1

## 2022-06-01 MED ORDER — MIDAZOLAM HCL 2 MG/2ML IJ SOLN
INTRAMUSCULAR | Status: DC | PRN
  Administered 2022-06-01: 2 mg via INTRAVENOUS

## 2022-06-01 MED ORDER — ONDANSETRON HCL 4 MG/2ML IJ SOLN
INTRAMUSCULAR | Status: DC | PRN
  Administered 2022-06-01: 4 mg via INTRAVENOUS

## 2022-06-01 MED ORDER — ORAL CARE MOUTH RINSE: 15.0000 mL | Freq: Once | OROMUCOSAL | Status: AC

## 2022-06-01 MED ORDER — MIDAZOLAM HCL 2 MG/2ML IJ SOLN
INTRAMUSCULAR | Status: AC
  Filled 2022-06-01: qty 2

## 2022-06-01 MED ORDER — OXYCODONE HCL 5 MG PO TABS: 5.0000 mg | ORAL_TABLET | Freq: Once | ORAL | Status: DC | PRN

## 2022-06-01 MED ORDER — EPHEDRINE SULFATE-NACL 50-0.9 MG/10ML-% IV SOSY
PREFILLED_SYRINGE | INTRAVENOUS | Status: DC | PRN
  Administered 2022-06-01 (×3): 5 mg via INTRAVENOUS

## 2022-06-01 MED ORDER — DEXAMETHASONE SODIUM PHOSPHATE 10 MG/ML IJ SOLN
INTRAMUSCULAR | Status: DC | PRN
  Administered 2022-06-01: 10 mg via INTRAVENOUS

## 2022-06-01 MED ORDER — CHLORHEXIDINE GLUCONATE 0.12 % MT SOLN
15.0000 mL | Freq: Once | OROMUCOSAL | Status: AC
  Administered 2022-06-01: 15 mL via OROMUCOSAL
  Filled 2022-06-01: qty 15

## 2022-06-01 MED ORDER — CEFAZOLIN SODIUM-DEXTROSE 2-4 GM/100ML-% IV SOLN
2.0000 g | INTRAVENOUS | Status: AC
  Administered 2022-06-01: 2 g via INTRAVENOUS
  Filled 2022-06-01: qty 100

## 2022-06-01 MED ORDER — PHENYLEPHRINE 80 MCG/ML (10ML) SYRINGE FOR IV PUSH (FOR BLOOD PRESSURE SUPPORT)
PREFILLED_SYRINGE | INTRAVENOUS | Status: DC | PRN
  Administered 2022-06-01: 160 ug via INTRAVENOUS
  Administered 2022-06-01 (×3): 80 ug via INTRAVENOUS
  Administered 2022-06-01: 160 ug via INTRAVENOUS
  Administered 2022-06-01: 80 ug via INTRAVENOUS

## 2022-06-01 MED ORDER — FENTANYL CITRATE (PF) 250 MCG/5ML IJ SOLN
INTRAMUSCULAR | Status: AC
  Filled 2022-06-01: qty 5

## 2022-06-01 MED ORDER — CELECOXIB 200 MG PO CAPS
200.0000 mg | ORAL_CAPSULE | Freq: Once | ORAL | Status: AC
  Administered 2022-06-01: 200 mg via ORAL
  Filled 2022-06-01: qty 1

## 2022-06-01 MED ORDER — DEXAMETHASONE SODIUM PHOSPHATE 10 MG/ML IJ SOLN
INTRAMUSCULAR | Status: AC
  Filled 2022-06-01: qty 1

## 2022-06-01 MED ORDER — ACETAMINOPHEN 500 MG PO TABS
1000.0000 mg | ORAL_TABLET | Freq: Once | ORAL | Status: AC
  Administered 2022-06-01: 1000 mg via ORAL
  Filled 2022-06-01: qty 2

## 2022-06-01 MED ORDER — ENSURE PRE-SURGERY PO LIQD: 296.0000 mL | Freq: Once | ORAL | Status: DC

## 2022-06-01 MED ORDER — PROPOFOL 10 MG/ML IV BOLUS
INTRAVENOUS | Status: AC
  Filled 2022-06-01: qty 20

## 2022-06-01 MED ORDER — BUPIVACAINE-EPINEPHRINE (PF) 0.25% -1:200000 IJ SOLN
INTRAMUSCULAR | Status: AC
  Filled 2022-06-01: qty 30

## 2022-06-01 MED ORDER — OXYCODONE HCL 5 MG/5ML PO SOLN: 5.0000 mg | Freq: Once | ORAL | Status: DC | PRN

## 2022-06-01 MED ORDER — BUPIVACAINE-EPINEPHRINE 0.25% -1:200000 IJ SOLN
INTRAMUSCULAR | Status: DC | PRN
  Administered 2022-06-01: 20 mL

## 2022-06-01 MED ORDER — FENTANYL CITRATE (PF) 250 MCG/5ML IJ SOLN
INTRAMUSCULAR | Status: DC | PRN
  Administered 2022-06-01: 50 ug via INTRAVENOUS

## 2022-06-01 MED ORDER — PROPOFOL 10 MG/ML IV BOLUS
INTRAVENOUS | Status: DC | PRN
  Administered 2022-06-01: 150 mg via INTRAVENOUS

## 2022-06-01 MED ORDER — 0.9 % SODIUM CHLORIDE (POUR BTL) OPTIME
TOPICAL | Status: DC | PRN
  Administered 2022-06-01: 1000 mL

## 2022-06-01 MED ORDER — LACTATED RINGERS IV SOLN: INTRAVENOUS | Status: DC

## 2022-06-01 MED ORDER — ONDANSETRON HCL 4 MG/2ML IJ SOLN
INTRAMUSCULAR | Status: AC
  Filled 2022-06-01: qty 2

## 2022-06-01 MED ORDER — TRAMADOL HCL 50 MG PO TABS: 50.0000 mg | ORAL_TABLET | Freq: Four times a day (QID) | ORAL | 0 refills | Status: DC | PRN

## 2022-06-01 MED ORDER — LIDOCAINE 2% (20 MG/ML) 5 ML SYRINGE
INTRAMUSCULAR | Status: DC | PRN
  Administered 2022-06-01: 20 mg via INTRAVENOUS

## 2022-06-01 MED ORDER — PHENYLEPHRINE 80 MCG/ML (10ML) SYRINGE FOR IV PUSH (FOR BLOOD PRESSURE SUPPORT)
PREFILLED_SYRINGE | INTRAVENOUS | Status: AC
  Filled 2022-06-01: qty 10

## 2022-06-01 MED ORDER — LIDOCAINE 2% (20 MG/ML) 5 ML SYRINGE
INTRAMUSCULAR | Status: AC
  Filled 2022-06-01: qty 5

## 2022-06-01 SURGICAL SUPPLY — 35 items
ADH SKN CLS APL DERMABOND .7 (GAUZE/BANDAGES/DRESSINGS) ×1
APL PRP STRL LF DISP 70% ISPRP (MISCELLANEOUS) ×1
APPLIER CLIP 9.375 MED OPEN (MISCELLANEOUS) ×1
APR CLP MED 9.3 20 MLT OPN (MISCELLANEOUS) ×1
BAG COUNTER SPONGE SURGICOUNT (BAG) ×2 IMPLANT
BAG SPNG CNTER NS LX DISP (BAG) ×1
BINDER BREAST LRG (GAUZE/BANDAGES/DRESSINGS) IMPLANT
BINDER BREAST XLRG (GAUZE/BANDAGES/DRESSINGS) IMPLANT
CANISTER SUCT 3000ML PPV (MISCELLANEOUS) ×2 IMPLANT
CHLORAPREP W/TINT 26 (MISCELLANEOUS) ×2 IMPLANT
CLIP APPLIE 9.375 MED OPEN (MISCELLANEOUS) ×2 IMPLANT
COVER PROBE W GEL 5X96 (DRAPES) ×2 IMPLANT
COVER SURGICAL LIGHT HANDLE (MISCELLANEOUS) ×2 IMPLANT
DERMABOND ADVANCED .7 DNX12 (GAUZE/BANDAGES/DRESSINGS) ×2 IMPLANT
DEVICE DUBIN SPECIMEN MAMMOGRA (MISCELLANEOUS) ×2 IMPLANT
DRAPE CHEST BREAST 15X10 FENES (DRAPES) ×2 IMPLANT
ELECT CAUTERY BLADE 6.4 (BLADE) ×2 IMPLANT
ELECT REM PT RETURN 9FT ADLT (ELECTROSURGICAL) ×1
ELECTRODE REM PT RTRN 9FT ADLT (ELECTROSURGICAL) ×2 IMPLANT
GLOVE SURG SIGNA 7.5 PF LTX (GLOVE) ×2 IMPLANT
GOWN STRL REUS W/ TWL LRG LVL3 (GOWN DISPOSABLE) ×2 IMPLANT
GOWN STRL REUS W/ TWL XL LVL3 (GOWN DISPOSABLE) ×2 IMPLANT
GOWN STRL REUS W/TWL LRG LVL3 (GOWN DISPOSABLE) ×2
GOWN STRL REUS W/TWL XL LVL3 (GOWN DISPOSABLE) ×1
KIT BASIN OR (CUSTOM PROCEDURE TRAY) ×2 IMPLANT
KIT MARKER MARGIN INK (KITS) ×2 IMPLANT
NDL HYPO 25GX1X1/2 BEV (NEEDLE) ×2 IMPLANT
NEEDLE HYPO 25GX1X1/2 BEV (NEEDLE) ×1 IMPLANT
NS IRRIG 1000ML POUR BTL (IV SOLUTION) IMPLANT
PACK GENERAL/GYN (CUSTOM PROCEDURE TRAY) ×2 IMPLANT
SUT MNCRL AB 4-0 PS2 18 (SUTURE) ×2 IMPLANT
SUT VIC AB 3-0 SH 18 (SUTURE) ×2 IMPLANT
SYR CONTROL 10ML LL (SYRINGE) ×2 IMPLANT
TOWEL GREEN STERILE (TOWEL DISPOSABLE) ×2 IMPLANT
TOWEL GREEN STERILE FF (TOWEL DISPOSABLE) ×2 IMPLANT

## 2022-06-01 NOTE — Interval H&P Note (Signed)
History and Physical Interval Note: no change in H and P  06/01/2022 7:46 AM  Lakea Rodrigo Ran  has presented today for surgery, with the diagnosis of RIGHT BREAST DCIS.  The various methods of treatment have been discussed with the patient and family. After consideration of risks, benefits and other options for treatment, the patient has consented to  Procedure(s): RIGHT BREAST LUMPECTOMY WITH RADIOACTIVE SEED LOCALIZATION (Right) as a surgical intervention.  The patient's history has been reviewed, patient examined, no change in status, stable for surgery.  I have reviewed the patient's chart and labs.  Questions were answered to the patient's satisfaction.     Abigail Miyamoto

## 2022-06-01 NOTE — Transfer of Care (Signed)
Immediate Anesthesia Transfer of Care Note  Patient: [name]  Procedure(s) Performed: RIGHT BREAST LUMPECTOMY WITH RADIOACTIVE SEED LOCALIZATION (Right: Breast)  Patient Location: PACU  Anesthesia Type:General  Level of Consciousness: drowsy  Airway & Oxygen Therapy: Patient Spontanous Breathing and Patient connected to face mask oxygen  Post-op Assessment: Report given to RN and Post -op Vital signs reviewed and stable  Post vital signs: Reviewed and stable  Last Vitals:  Vitals Value Taken Time  BP 148/77 06/01/22 0855  Temp 36.4 C 06/01/22 0855  Pulse 91 06/01/22 0858  Resp 15 06/01/22 0858  SpO2 92 % 06/01/22 0858  Vitals shown include unvalidated device data.  Last Pain:  Vitals:   06/01/22 0855  TempSrc:   PainSc: Asleep      Patients Stated Pain Goal: 0 (06/01/22 1610)  Complications: No notable events documented.

## 2022-06-01 NOTE — Anesthesia Postprocedure Evaluation (Signed)
Anesthesia Post Note  Patient: [name]  Procedure(s) Performed: RIGHT BREAST LUMPECTOMY WITH RADIOACTIVE SEED LOCALIZATION (Right: Breast)     Patient location during evaluation: PACU Anesthesia Type: General Level of consciousness: awake and alert Pain management: pain level controlled Vital Signs Assessment: post-procedure vital signs reviewed and stable Respiratory status: spontaneous breathing, nonlabored ventilation and respiratory function stable Cardiovascular status: stable and blood pressure returned to baseline Anesthetic complications: no   No notable events documented.  Last Vitals:  Vitals:   06/01/22 0915 06/01/22 0930  BP: (!) 144/77 124/73  Pulse: 87 88  Resp: 16 15  Temp:  36.4 C  SpO2: 93% 100%    Last Pain:  Vitals:   06/01/22 0930  TempSrc:   PainSc: 0-No pain                 Beryle Lathe

## 2022-06-01 NOTE — Anesthesia Procedure Notes (Signed)
Procedure Name: LMA Insertion Date/Time: 06/01/2022 8:03 AM  Performed by: Zollie Beckers, CRNAPre-anesthesia Checklist: Patient identified, Emergency Drugs available, Suction available and Patient being monitored Patient Re-evaluated:Patient Re-evaluated prior to induction Oxygen Delivery Method: Circle System Utilized Preoxygenation: Pre-oxygenation with 100% oxygen Induction Type: IV induction Ventilation: Mask ventilation without difficulty LMA: LMA inserted LMA Size: 4.0 Number of attempts: 1 Placement Confirmation: positive ETCO2 Tube secured with: Tape Dental Injury: Teeth and Oropharynx as per pre-operative assessment

## 2022-06-01 NOTE — Discharge Instructions (Addendum)
[facility] Office Phone Number [phone]  BREAST BIOPSY/ PARTIAL MASTECTOMY: POST OP INSTRUCTIONS  Always review your discharge instruction sheet given to you by the facility where your surgery was performed.  IF YOU HAVE DISABILITY OR FAMILY LEAVE FORMS, YOU MUST BRING THEM TO THE OFFICE FOR PROCESSING.  DO NOT GIVE THEM TO YOUR DOCTOR.  A prescription for pain medication may be given to you upon discharge.  Take your pain medication as prescribed, if needed.  If narcotic pain medicine is not needed, then you may take acetaminophen (Tylenol) or ibuprofen (Advil) as needed. Take your usually prescribed medications unless otherwise directed If you need a refill on your pain medication, please contact your pharmacy.  They will contact our office to request authorization.  Prescriptions will not be filled after 5pm or on week-ends. You should eat very light the first 24 hours after surgery, such as soup, crackers, pudding, etc.  Resume your normal diet the day after surgery. Most patients will experience some swelling and bruising in the breast.  Ice packs and a good support bra will help.  Swelling and bruising can take several days to resolve.  It is common to experience some constipation if taking pain medication after surgery.  Increasing fluid intake and taking a stool softener will usually help or prevent this problem from occurring.  A mild laxative (Milk of Magnesia or Miralax) should be taken according to package directions if there are no bowel movements after 48 hours. Unless discharge instructions indicate otherwise, you may remove your bandages 24-48 hours after surgery, and you may shower at that time.  You may have steri-strips (small skin tapes) in place directly over the incision.  These strips should be left on the skin for 7-10 days.  If your surgeon used skin glue on the incision, you may shower in 24 hours.  The glue will flake off over the next 2-3 weeks.  Any  sutures or staples will be removed at the office during your follow-up visit. ACTIVITIES:  You may resume regular daily activities (gradually increasing) beginning the next day.  Wearing a good support bra or sports bra minimizes pain and swelling.  You may have sexual intercourse when it is comfortable. You may drive when you no longer are taking prescription pain medication, you can comfortably wear a seatbelt, and you can safely maneuver your car and apply brakes. RETURN TO WORK:  ______________________________________________________________________________________ Bonita Quin should see your doctor in the office for a follow-up appointment approximately two weeks after your surgery.  Your doctor's nurse will typically make your follow-up appointment when she calls you with your pathology report.  Expect your pathology report 2-3 business days after your surgery.  You may call to check if you do not hear from [place] after three days. OTHER INSTRUCTIONS: _YOU MAY REMOVE THE BINDER AND SHOWER STARTING TOMORROW AND THEN PUT THE BINDER BACK ON  ICE PACK, TYLENOL, AND IBUPROFEN ALSO FOR PAIN NO VIGOROUS ACTIVITY FOR ONE WEEK ______________________________________________________________________________________________ _____________________________________________________________________________________________________________________________________ _____________________________________________________________________________________________________________________________________ _____________________________________________________________________________________________________________________________________  WHEN TO CALL YOUR DOCTOR: Fever over 101.0 Nausea and/or vomiting. Extreme swelling or bruising. Continued bleeding from incision. Increased pain, redness, or drainage from the incision.  The clinic staff is available to answer your questions during regular business hours.  Please don't hesitate to  call and ask to speak to one of the nurses for clinical concerns.  If you have a medical emergency, go to the nearest emergency room or call 911.  A surgeon from [facility] is  always on call at the hospital.  For further questions, please visit [email or web address]

## 2022-06-01 NOTE — Op Note (Signed)
RIGHT BREAST LUMPECTOMY WITH RADIOACTIVE SEED LOCALIZATION  Procedure Note  AUBREIGH FUERTE 06/01/2022   Pre-op Diagnosis: RIGHT BREAST DCIS     Post-op Diagnosis: same  Procedure(s): RIGHT BREAST LUMPECTOMY WITH RADIOACTIVE SEED LOCALIZATION  Surgeon(s): Abigail Miyamoto, MD  Anesthesia: General  Staff:  Circulator: Tawny Hopping, RN Scrub Person: Hermelinda Dellen, RN; Carmela Rima  Estimated Blood Loss: Minimal               Specimens: sent to path.  Anterior margin is skin and posterior margin is chest wall  Indications: This is a 67 year old female was found on recent screen mammography to have an abnormal mass at the 2:30 position of the right breast.  She underwent a biopsy showing a papillary lesion with ductal carcinoma in situ.  The area measured 1.1 cm.  The decision was made to proceed to the operating room for radioactive seed guided right breast lumpectomy  Procedure: The patient was brought to the operating identified as a correct patient.  She was placed supine on the operating table and general anesthesia was induced.  Her right breast was then prepped and draped in the usual sterile fashion.  Using the neoprobe the radioactive seed was located at the 2:30 position to 3 o'clock position of the right breast just medial to the nipple areolar complex.  I anesthetized the skin at the edge of the nipple areolar complex medially with Marcaine and then made a circumareolar incision with a scalpel.  I then dissected down to the breast tissue with electrocautery.  I then dissected medially staying just underneath the skin and going past the signal from the radioactive seed with the aid of the neoprobe.  I then took the dissection down to the chest wall.  I then dissected both superior and inferiorly moving back toward the nipple areolar complex.  I then dissected laterally to the radioactive seed going down to the chest wall as well.  I then completed lumpectomy coming  underneath the signal from the radioactive seed with the cautery.  Once the specimen was removed I marked all margins with paint.  An x-ray was performed on the specimen confirming that the radioactive seed and previous biopsy clip were in the specimen.  The lumpectomy specimen was then sent to pathology for evaluation.  I achieved hemostasis with the cautery.  I placed surgical clips around the periphery of the lumpectomy cavity for marking purposes.  The subcutaneous tissue was then closed with interrupted 3-0 Vicryl sutures and the skin was closed with a running 4-0 Monocryl.  Dermabond was then applied.  The patient tolerated the procedure well.  All the counts were correct at the end of the procedure.  The patient was then extubated in the operating room and taken in a stable condition to the recovery room.          Abigail Miyamoto   Date: 06/01/2022  Time: 8:44 AM

## 2022-06-02 ENCOUNTER — Telehealth: Payer: Self-pay | Admitting: *Deleted

## 2022-06-02 ENCOUNTER — Encounter (HOSPITAL_COMMUNITY): Payer: Self-pay | Admitting: Surgery

## 2022-06-02 NOTE — Telephone Encounter (Signed)
Spoke to pt, relate doing well after surgery. Noted some soreness. Provided pt number to Dr. Eliberto Ivory office with questions or needs around surgical postop concerns

## 2022-06-03 LAB — SURGICAL PATHOLOGY

## 2022-06-08 ENCOUNTER — Encounter: Payer: Self-pay | Admitting: *Deleted

## 2022-06-10 ENCOUNTER — Ambulatory Visit: Payer: Self-pay | Admitting: Genetic Counselor

## 2022-06-10 ENCOUNTER — Encounter: Payer: Self-pay | Admitting: Genetic Counselor

## 2022-06-10 ENCOUNTER — Other Ambulatory Visit: Payer: Self-pay

## 2022-06-10 ENCOUNTER — Telehealth: Payer: Self-pay | Admitting: Genetic Counselor

## 2022-06-10 DIAGNOSIS — Z1379 Encounter for other screening for genetic and chromosomal anomalies: Secondary | ICD-10-CM | POA: Insufficient documentation

## 2022-06-10 DIAGNOSIS — D0511 Intraductal carcinoma in situ of right breast: Secondary | ICD-10-CM

## 2022-06-10 DIAGNOSIS — Z803 Family history of malignant neoplasm of breast: Secondary | ICD-10-CM

## 2022-06-10 MED ORDER — LISINOPRIL 20 MG PO TABS: 40.0000 mg | ORAL_TABLET | Freq: Every day | ORAL | 6 refills | Status: DC

## 2022-06-10 NOTE — Telephone Encounter (Signed)
Disclosed negative genetics.    

## 2022-06-10 NOTE — Progress Notes (Signed)
HPI:   [name] was previously seen in the [facility] due to a personal and family history of breast cancer and concerns regarding a hereditary predisposition to cancer.    [name] recent genetic test results were disclosed to her by telephone. These results and recommendations are discussed in more detail below.  CANCER HISTORY:  Oncology History  Ductal carcinoma in situ (DCIS) of right breast  04/24/2022 Mammogram   Screening mammogram showed possible focal asymmetry in the right breast which is indeterminate.  Unilateral diagnostic mammogram showed 0.8 cm irregular asymmetry in the right breast.  Ultrasound confirmed the findings, size estimated to be 9 x 1.1 x 1 cm irregular mass   05/07/2022 Pathology Results   Right breast needle core biopsy showed DCIS involving a fibrotic papillary lesion, cribriform, intermediate nuclear grade, no necrosis, no calcs.  Prognostic showed ER 95% positive strong staining PR 90% positive strong staining   05/17/2022 Initial Diagnosis   Ductal carcinoma in situ (DCIS) of right breast   05/28/2022 Genetic Testing   Negative [facility] Common Hereditary Cancers +RNA Panel.  Report date is 05/28/2022.    The [facility] Common Hereditary Cancers + RNA Panel includes sequencing, deletion/duplication, and RNA analysis of the following 48 genes: APC, ATM, AXIN2, BAP1, BARD1, BMPR1A, BRCA1, BRCA2, BRIP1, CDH1, CDK4*, CDKN2A*, CHEK2, CTNNA1, DICER1, EPCAM* (del/dup only), FH, GREM1* (promoter dup analysis only), HOXB13*, KIT*, MBD4*, MEN1, MLH1, MSH2, MSH3, MSH6, MUTYH, NF1, NTHL1, PALB2, PDGFRA*, PMS2, POLD1, POLE, PTEN, RAD51C, RAD51D, SDHA (sequencing only), SDHB, SDHC, SDHD, SMAD4, SMARCA4, STK11, TP53, TSC1, TSC2, VHL.  *Genes without RNA analysis.      FAMILY HISTORY:  We obtained a detailed, 4-generation family history.  Significant diagnoses are listed below:      Family History  Problem Relation Age of Onset   Breast cancer Sister  43   Prostate cancer Maternal Uncle     Cancer Maternal Grandmother          unknown primary; mets; d. early 54s   Breast cancer Cousin          dx 4s-60s; paternal female cousin     Ms.[name] states that her sister with a history of breast cancer may have had genetic testing previously.     There is no reported Ashkenazi Jewish ancestry. There is no known consanguinity.    GENETIC TEST RESULTS:  The [facility] Common Hereditary Cancers +RNA Panel found no pathogenic mutations.    The [facility] Common Hereditary Cancers + RNA Panel includes sequencing, deletion/duplication, and RNA analysis of the following 48 genes: APC, ATM, AXIN2, BAP1, BARD1, BMPR1A, BRCA1, BRCA2, BRIP1, CDH1, CDK4*, CDKN2A*, CHEK2, CTNNA1, DICER1, EPCAM* (del/dup only), FH, GREM1* (promoter dup analysis only), HOXB13*, KIT*, MBD4*, MEN1, MLH1, MSH2, MSH3, MSH6, MUTYH, NF1, NTHL1, PALB2, PDGFRA*, PMS2, POLD1, POLE, PTEN, RAD51C, RAD51D, SDHA (sequencing only), SDHB, SDHC, SDHD, SMAD4, SMARCA4, STK11, TP53, TSC1, TSC2, VHL.  *Genes without RNA analysis.  .   The test report has been scanned into EPIC and is located under the Molecular Pathology section of the Results Review tab.  A portion of the result report is included below for reference. Genetic testing reported out on May 28, 2022.      Even though a pathogenic variant was not identified, possible explanations for the cancer in the family may include: There may be no hereditary risk for cancer in the family. The cancers in [name] and/or her family may be sporadic/familial or due to other genetic and  environmental factors. There may be a gene mutation in one of these genes that current testing methods cannot detect but that chance is small. There could be another gene that has not yet been discovered, or that we have not yet tested, that is responsible for the cancer diagnoses in the family.  It is also possible there is a hereditary cause for the cancer in the  family that [name] did not inherit.   Therefore, it is important to remain in touch with cancer genetics in the future so that we can continue to offer [name] the most up to date genetic testing.    ADDITIONAL GENETIC TESTING:   [name] genetic testing was fairly extensive.  If there are additional relevant genes identified to increase cancer risk that can be analyzed in the future, we would be happy to discuss and coordinate this testing at that time.      CANCER SCREENING RECOMMENDATIONS:  [name] test result is considered negative (normal).  This means that we have not identified a hereditary cause for her personal history of breast cancer at this time.    An individual's cancer risk and medical management are not determined by genetic test results alone. Overall cancer risk assessment incorporates additional factors, including personal medical history, family history, and any available genetic information that may result in a personalized plan for cancer prevention and surveillance. Therefore, it is recommended she continue to follow the cancer management and screening guidelines provided by her oncology and primary healthcare provider.   RECOMMENDATIONS FOR FAMILY MEMBERS:   Since she did not inherit a identifiable mutation in a cancer predisposition gene included on this panel, her children could not have inherited a known mutation from her in one of these genes. Individuals in this family might be at some increased risk of developing cancer, over the general population risk, due to the family history of cancer.  Individuals in the family should notify their providers of the family history of cancer. We recommend women in this family have a yearly mammogram beginning at age 31, or 68 years younger than the earliest onset of cancer, an annual clinical breast exam, and perform monthly breast self-exams.  Risk models that take into account family history and hormonal history  may be helpful in determining appropriate breast cancer screening options for family members.  Other members of the family may still carry a pathogenic variant in one of these genes that [name] did not inherit. Based on the family history, we recommend her sister, who was diagnosed with breast cancer in her 77s, have genetic counseling and testing. [name] can let us know if we can be of any assistance in coordinating genetic counseling and/or testing for these family member.    FOLLOW-UP:  Cancer genetics is a rapidly advancing field and it is possible that new genetic tests will be appropriate for her and/or her family members in the future. We encourage [name] to remain in contact with cancer genetics, so we can update her personal and family histories and let her know of advances in cancer genetics that may benefit this family.   Our contact number was provided.. she knows she is welcome to call us at anytime with additional questions or concerns.   Benjimin Hadden M. Rennie Plowman, MS, [facility] Genetic Counselor [email or web address] (P) [phone]

## 2022-06-15 NOTE — Progress Notes (Signed)
Location of Breast Cancer:DCIS right breast  Histology per Pathology Report:  06-01-22 FINAL MICROSCOPIC DIAGNOSIS:  A. BREAST, RIGHT, LUMPECTOMY: - Ductal carcinoma in situ, intermediate grade, partially involving a sclerosing papillary lesion - No evidence of invasive carcinoma, see comment - Resection margins are negative for DCIS; closest is the posterior margin at 0.3 cm - Biopsy site changes - See oncology table  COMMENT:  Immunohistochemical stains for p63, calponin and SMM 1 do not show evidence of invasive carcinoma.  ONCOLOGY TABLE:  Procedure: Lumpectomy Specimen Laterality: Right Histologic Type: Ductal carcinoma in situ Size of DCIS: 1.4 cm Nuclear Grade: Intermediate grade Necrosis: Not identified Margins: All margins negative for DCIS      Specify Closest Margin (required only if <55mm): Posterior margin at 0.3 cm Regional Lymph Nodes: Not applicable (no lymph nodes submitted or found) Breast Biomarker Testing Performed on Previous Biopsy:      Testing performed on Case Number: [id number]      Estrogen Receptor: 95%, positive, Strong staining intensity      Progesterone Receptor: 90%, positive, Strong staining intensity Pathologic Stage Classification (pTNM, AJCC 8th Edition): pTis, pN not assigned Representative Tumor Block: A5  Receptor Status: ER(95%), PR (90%), Her2-neu (), Ki-67()  Did patient present with symptoms (if so, please note symptoms) or was this found on screening mammography?: screening mammogram  Past/Anticipated interventions by surgeon, if any: 06-01-22 Pre-op Diagnosis: RIGHT BREAST DCIS     Post-op Diagnosis: same   Procedure(s): RIGHT BREAST LUMPECTOMY WITH RADIOACTIVE SEED LOCALIZATION   Surgeon(s): Abigail Miyamoto, MD  Assessment and Plan:  Right breast ductal carcinoma in situ  I had a discussion with the patient and her niece regarding her diagnosis. We also discussed her and her multidisciplinary breast cancer  conference. From a surgical standpoint we next discussed surgery for breast cancer. We discussed both breast conservation and mastectomy. She is interested in breast conservation. I next discussed proceeding with a radioactive seed guided right breast lumpectomy. I explained the surgical procedure in detail. We discussed the risk which includes but is not limited to bleeding, infection, the need for further surgery if margins are positive, cardiopulmonary issues, DVT, postoperative recovery, etc. She understands and wished to proceed with surgery which will be scheduled.   Past/Anticipated interventions by medical oncology, if any:  05-19-22 Oncology History  Ductal carcinoma in situ (DCIS) of right breast  04/24/2022 Mammogram    Screening mammogram showed possible focal asymmetry in the right breast which is indeterminate.  Unilateral diagnostic mammogram showed 0.8 cm irregular asymmetry in the right breast.  Ultrasound confirmed the findings, size estimated to be 9 x 1.1 x 1 cm irregular mass    05/07/2022 Pathology Results    Right breast needle core biopsy showed DCIS involving a fibrotic papillary lesion, cribriform, intermediate nuclear grade, no necrosis, no calcs.  Prognostic showed ER 95% positive strong staining PR 90% positive strong staining    05/17/2022 Initial Diagnosis    Ductal carcinoma in situ (DCIS) of right breast   ASSESSMENT AND PLAN:  Ductal carcinoma in situ (DCIS) of right breast This is a very pleasant 67 year old postmenopausal female patient with newly diagnosed right breast DCIS, grade 2, ER/PR positive referred to breast [facility] for additional recommendations.  We have discussed the following details about DCIS.   Pathology review: I discussed with the patient the difference between DCIS and invasive breast cancer. It is considered a precancerous lesion. DCIS is classified as a Stage 0 breast cancer. It is generally detected  through mammograms as calcifications. We  discussed the significance of grades and its impact on prognosis. We also discussed the importance of ER and PR receptors and their implications to adjuvant treatment options. Prognosis of DCIS dependence on grade and degree of comedo necrosis. It is anticipated that if not treated, 20-30% of DCIS can develop into invasive breast cancer.   Recommendation: 1. Breast conserving surgery 2. Followed by adjuvant radiation therapy 3. Followed by antiestrogen therapy with tamoxifen/aromatase inhibitors based on menopausal status 5 years   Tamoxifen counseling: We discussed the risks and benefits of tamoxifen. These include but not limited to insomnia, hot flashes, mood changes, vaginal dryness, and weight gain. Although rare, serious side effects including endometrial cancer, risk of blood clots were also discussed. We strongly believe that the benefits far outweigh the risks. Patient understands these risks and consented to starting treatment. Planned treatment duration is 5 years.   Aromatase inhibitors counseling: We have discussed the mechanism of action of aromatase inhibitors today.  We have discussed adverse effects including but not limited to menopausal symptoms, increased risk of osteoporosis and fractures, cardiovascular events, arthralgias and myalgias.  We do believe that the benefits far outweigh the risks.  Plan treatment duration of 5 years.   Given her history of DVT in the past after hysterectomy, she is leaning towards aromatase inhibitors.  She will return to clinic after radiation to initiate antiestrogen therapy.  Will order a baseline bone density, she never had one.   Lymphedema issues, if any:  none to report   Pain issues, if any: pain is better with compression bra, describes as soreness   SAFETY ISSUES: Prior radiation? no Pacemaker/ICD? no Possible current pregnancy?no Is the patient on methotrexate? no  Current Complaints / other details:  no questions at this time. 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 She would like to have appointments in the afternoon after work if possible.

## 2022-06-22 ENCOUNTER — Encounter: Payer: Self-pay | Admitting: Internal Medicine

## 2022-06-22 ENCOUNTER — Ambulatory Visit: Payer: BLUE CROSS/BLUE SHIELD | Admitting: Internal Medicine

## 2022-06-22 VITALS — BP 162/92 | HR 88 | Resp 16 | Ht 66.0 in | Wt 164.0 lb

## 2022-06-22 DIAGNOSIS — I10 Essential (primary) hypertension: Secondary | ICD-10-CM

## 2022-06-22 DIAGNOSIS — E782 Mixed hyperlipidemia: Secondary | ICD-10-CM

## 2022-06-22 DIAGNOSIS — Z86711 Personal history of pulmonary embolism: Secondary | ICD-10-CM

## 2022-06-22 NOTE — Progress Notes (Signed)
Primary Physician/Referring:  Leilani Able, MD  Patient ID: [name], female    DOB: Oct 25, 1955, 67 y.o.   MRN: [id number]  Chief Complaint  Patient presents with   Hypertension   Follow-up    3 month   HPI:    [name]  is a 67 y.o. female with past medical history significant for hypertension, hyperlipidemia, and history of pulmonary embolus who is here for a follow-up visit. Her blood pressure is pretty elevated right now since she has a follow-up with her cancer doctor immediately after. She had surgery for her breast cancer and is hoping to be clear when she sees the surgeon but she is still quite scared. She did have very well controlled blood pressure at two separate appointments last month and at home she states it is also well controlled since we adjusted her medications. She denies chest pain, shortness of breath, diaphoresis, syncope, edema, orthopnea, PND, claudication.   Past Medical History:  Diagnosis Date   Allergy    Anemia    Asthma    Breast cancer (HCC)    Cataract    Chest pain    H/O hysterectomy for benign disease    Heart murmur    Hyperlipidemia    Hypertension    PE (pulmonary embolism)    Pre-diabetes    Pulmonary infarction (HCC)    Thyroid disease    Past Surgical History:  Procedure Laterality Date   ABDOMINAL HYSTERECTOMY     BREAST LUMPECTOMY WITH RADIOACTIVE SEED LOCALIZATION Right 06/01/2022   Procedure: RIGHT BREAST LUMPECTOMY WITH RADIOACTIVE SEED LOCALIZATION;  Surgeon: Abigail Miyamoto, MD;  Location: [facility] OR;  Service: General;  Laterality: Right;   Family History  Problem Relation Age of Onset   Heart failure Mother    Diabetes Mother    Kidney disease Mother    Breast cancer Sister 5   Prostate cancer Maternal Uncle    Cancer Maternal Grandmother        unknown primary; mets; d. early 69s   Diabetes Other    Hypertension Other    Cancer Other    Asthma Other    Breast cancer Cousin        dx 57s-60s; paternal  female cousin   Colon cancer Neg Hx    Esophageal cancer Neg Hx    Rectal cancer Neg Hx    Stomach cancer Neg Hx    Pancreatic cancer Neg Hx     Social History   Tobacco Use   Smoking status: Never   Smokeless tobacco: Never  Substance Use Topics   Alcohol use: No   Marital Status: Married  ROS  Review of Systems  Cardiovascular:  Negative for irregular heartbeat and palpitations.   Objective  Blood pressure (!) 162/92, pulse 88, resp. rate 16, height 5\' 6"  (1.676 m), weight 164 lb (74.4 kg), SpO2 98 %. Body mass index is 26.47 kg/m.     06/22/2022    1:16 PM 06/01/2022    9:30 AM 06/01/2022    9:15 AM  Vitals with BMI  Height 5\' 6"     Weight 164 lbs    BMI 26.48    Systolic 162 124 253  Diastolic 92 73 77  Pulse 88 88 87     Physical Exam Vitals reviewed.  HENT:     Head: Normocephalic and atraumatic.  Cardiovascular:     Rate and Rhythm: Normal rate and regular rhythm.     Pulses: Normal pulses.  Heart sounds: Normal heart sounds. No murmur heard. Pulmonary:     Effort: Pulmonary effort is normal.     Breath sounds: Normal breath sounds.  Abdominal:     General: Bowel sounds are normal.  Musculoskeletal:     Right lower leg: No edema.     Left lower leg: No edema.  Skin:    General: Skin is warm and dry.  Neurological:     Mental Status: She is alert.     Medications and allergies   Allergies  Allergen Reactions   Shellfish Allergy Anaphylaxis   Hydrocodone, Benzhydrocodone, And Hydromorphone Other (See Comments)    Sweating per patient     Medication list after today's encounter   Current Outpatient Medications:    COLLAGEN PO, Take 3 tablets by mouth daily., Disp: , Rfl:    lisinopril (ZESTRIL) 20 MG tablet, Take 2 tablets (40 mg total) by mouth at bedtime., Disp: 60 tablet, Rfl: 6   carboxymethylcellulose (REFRESH PLUS) 0.5 % SOLN, 1-2 drops 3 (three) times daily as needed (dry/irritated eyes.)., Disp: , Rfl:    metoprolol succinate  (TOPROL-XL) 50 MG 24 hr tablet, Take 1 tablet (50 mg total) by mouth daily. Take with or immediately following a meal., Disp: 90 tablet, Rfl: 3   Multiple Vitamin (MULTIVITAMIN WITH MINERALS) TABS tablet, Take 1 tablet by mouth in the morning., Disp: , Rfl:   Laboratory examination:   Lab Results  Component Value Date   NA 142 05/19/2022   K 4.7 05/19/2022   CO2 30 05/19/2022   GLUCOSE 87 05/19/2022   BUN 11 05/19/2022   CREATININE 1.06 (H) 05/19/2022   CALCIUM 10.0 05/19/2022   GFRNONAA 59 (L) 05/19/2022       Latest Ref Rng & Units 05/19/2022    8:18 AM 11/16/2021    2:33 PM 03/30/2018    3:41 PM  CMP  Glucose 70 - 99 mg/dL 87  409  811   BUN 8 - 23 mg/dL 11  12  18    Creatinine 0.44 - 1.00 mg/dL 9.14  7.82  9.56   Sodium 135 - 145 mmol/L 142  143  137   Potassium 3.5 - 5.1 mmol/L 4.7  4.7  3.5   Chloride 98 - 111 mmol/L 107  106  104   CO2 22 - 32 mmol/L 30  30  28    Calcium 8.9 - 10.3 mg/dL 21.3  9.8  8.9   Total Protein 6.5 - 8.1 g/dL 7.3     Total Bilirubin 0.3 - 1.2 mg/dL 0.5     Alkaline Phos 38 - 126 U/L 58     AST 15 - 41 U/L 14     ALT 0 - 44 U/L 11         Latest Ref Rng & Units 05/19/2022    8:18 AM 11/16/2021    2:33 PM 03/30/2018    3:41 PM  CBC  WBC 4.0 - 10.5 K/uL 5.5  7.2  6.9   Hemoglobin 12.0 - 15.0 g/dL 08.6  57.8  46.9   Hematocrit 36.0 - 46.0 % 37.7  39.6  37.5   Platelets 150 - 400 K/uL 321  326  318     Lipid Panel No results for input(s): "CHOL", "TRIG", "LDLCALC", "VLDL", "HDL", "CHOLHDL", "LDLDIRECT" in the last 8760 hours.  HEMOGLOBIN A1C No results found for: "HGBA1C", "MPG" TSH No results for input(s): "TSH" in the last 8760 hours.  External labs:     Radiology:  Cardiac Studies:   Exercise Sestamibi stress test 01/12/2022: Exercise nuclear stress test was performed using Bruce protocol.   1 Day Rest and Stress images. Exercise time 3 minutes, achieved 4.64 METS, 87% APMHR.  Stress ECG nondiagnostic for ischemia due to  baseline ST-T changes. Hypertensive response to exercise (rest 150/86, exercise 260/100 mmHg) Normal myocardial perfusion without convincing evidence of reversible myocardial ischemia or prior infarct. Left ventricular size normal, wall motion preserved, calculated LVEF 71%, visually hyperdynamic. No prior study for comparison. Low risk study based on perfusion images; however, reduced functional capacity for age, hypertensive blood pressure and accelerated heart rate response to exercise.  Clinical correlation warranted.   Echocardiogram 01/12/2022:  Normal LV systolic function with visual EF 60-65%. Left ventricle cavity  is normal in size. Normal left ventricular wall thickness. Normal global  wall motion. Doppler evidence of grade I (impaired) diastolic dysfunction,  normal LAP. Calculated EF 71%.  Structurally normal mitral valve.  Mild (Grade I) mitral regurgitation.  Structurally normal tricuspid valve.  Mild tricuspid regurgitation. No  evidence of pulmonary hypertension.    EKG:   12/16/2021: NSR, normal axis, normal R wave progression, no ischemia  Assessment     ICD-10-CM   1. Essential hypertension  I10     2. History of pulmonary embolus (PE)  Z86.711     3. Mixed hyperlipidemia  E78.2        No orders of the defined types were placed in this encounter.   No orders of the defined types were placed in this encounter.   Medications Discontinued During This Encounter  Medication Reason   traMADol (ULTRAM) 50 MG tablet       Recommendations:   [name] is a 66 y.o.  female with HTN and HLD  Essential hypertension BP high right now as she has follow-up with her surgeon regarding her breast cancer right after our visit At 2 separate appointments last month, her blood pressure was very well controlled and it is controlled at home Continue Toprol-XL 50mg  qDay, tolerating without side effects She is tolerating Lisinopril to 40mg  qHS. Creatinine remains  stable Encourage low-sodium diet, less than 2000 mg daily. Follow-up in 6 months or sooner if needed.   Mixed hyperlipidemia Continue Crestor   History of pulmonary embolus (PE) Completed anticoagulation, no further episodes     Brenda Dieter, DO  06/23/2022, 10:06 AM Office: [phone] Pager: [phone]

## 2022-06-28 ENCOUNTER — Encounter: Payer: Self-pay | Admitting: Radiation Oncology

## 2022-06-28 ENCOUNTER — Telehealth: Payer: Self-pay

## 2022-06-28 IMAGING — CR DG CHEST 2V
2 series · 2 of 2 positions shown · non-contrast
Comparison: 03/30/2018

CLINICAL DATA: Cough, chest congestion

EXAM:
CHEST - 2 VIEW

[w chest pa]
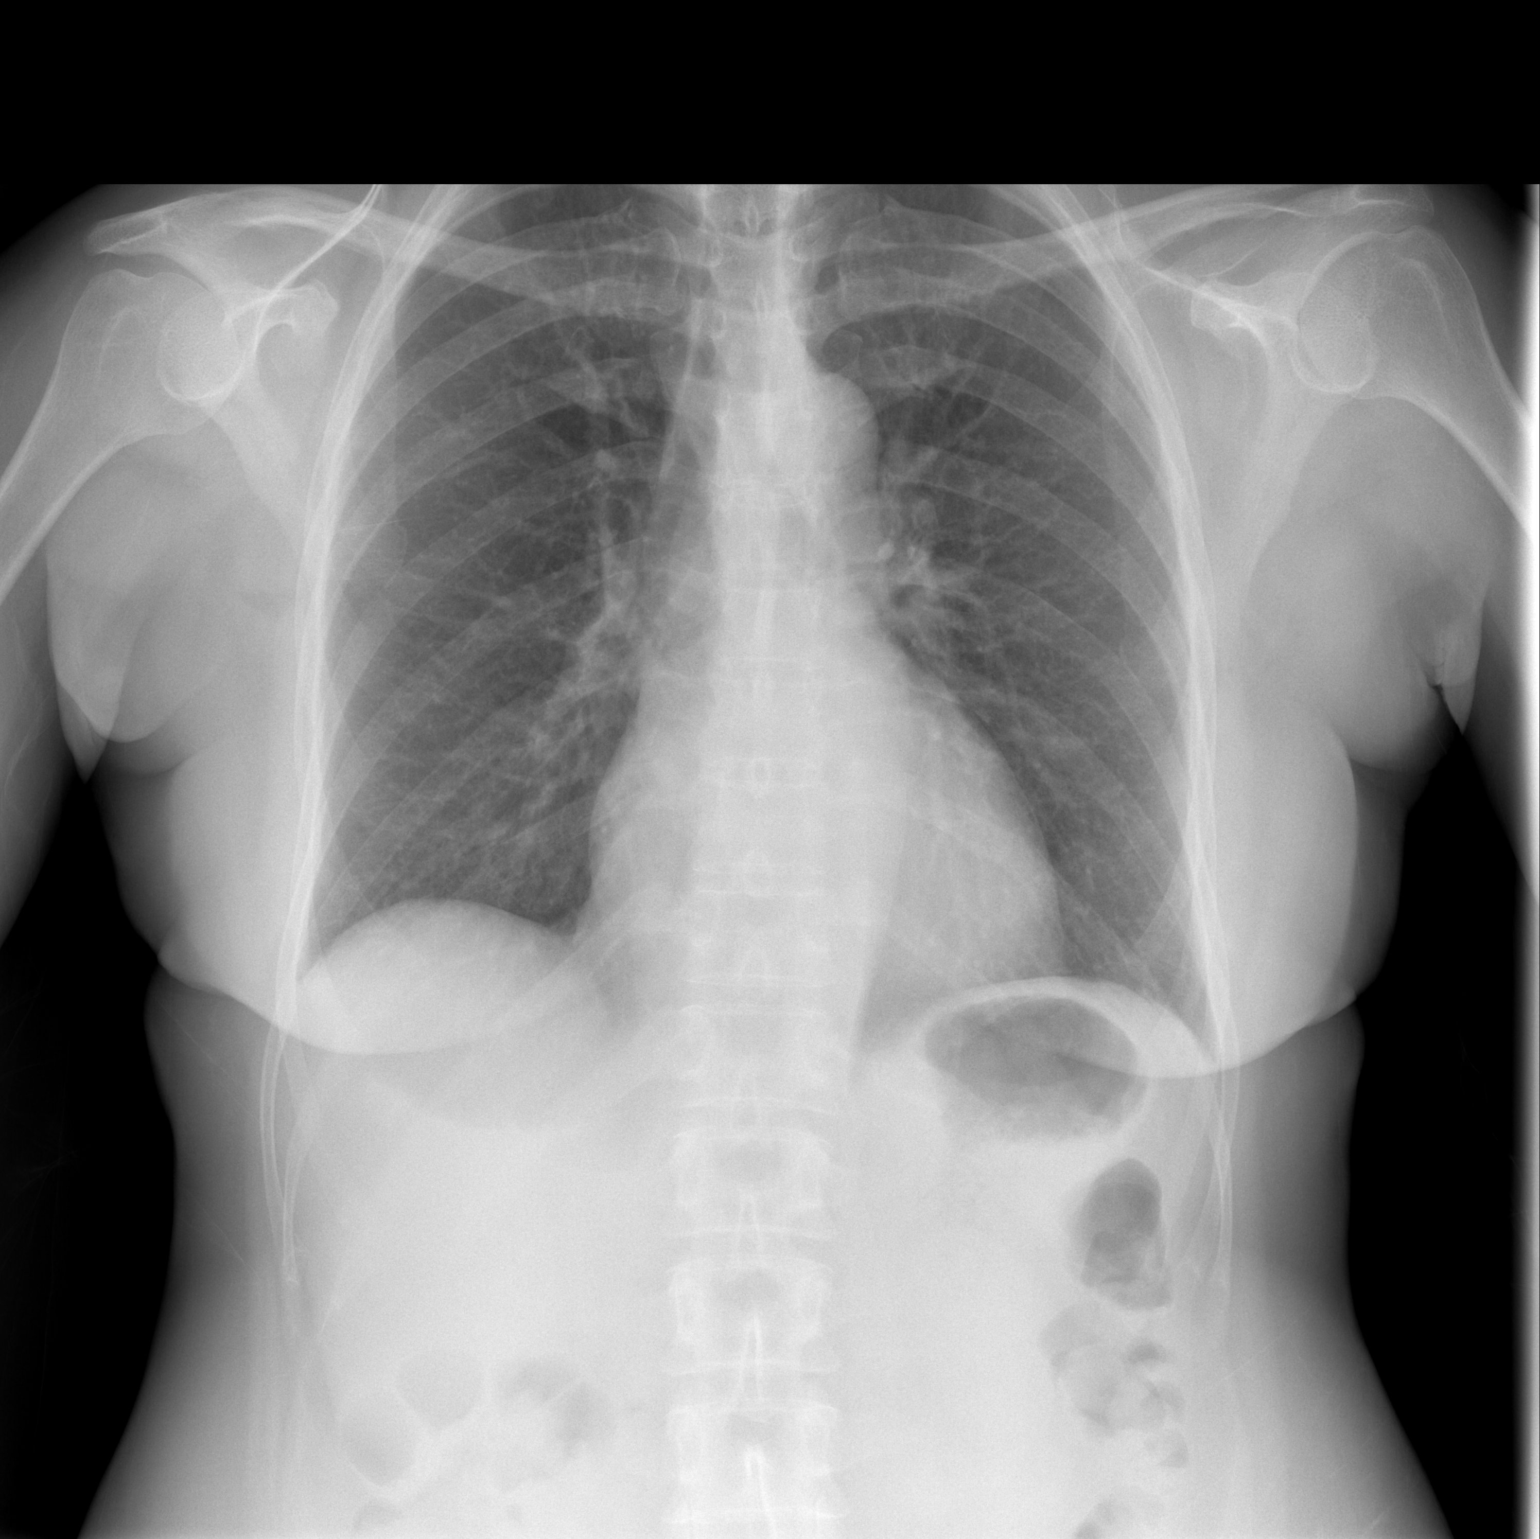

[w chest lat]
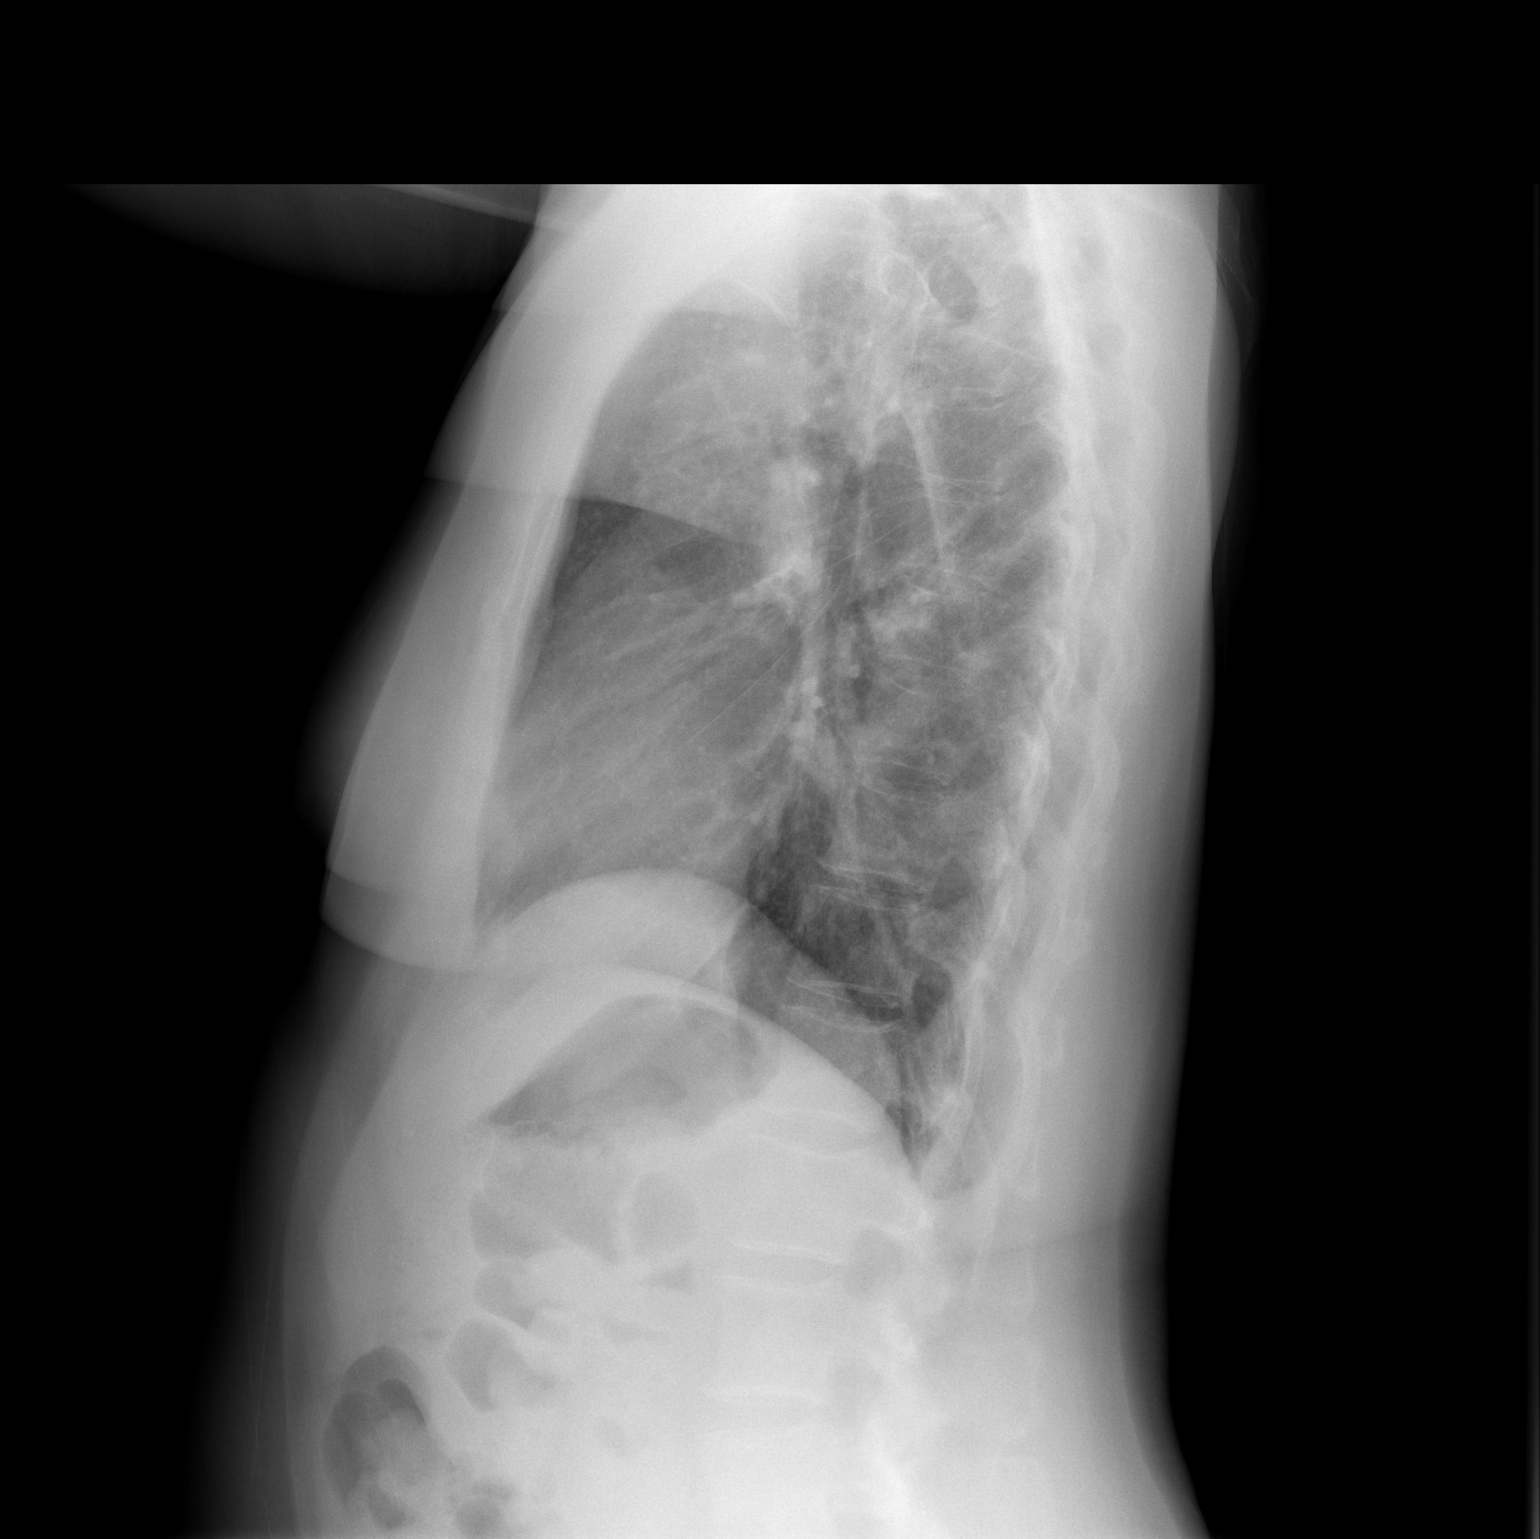

[2 of 2 positions shown; findings below may reference images not displayed]

FINDINGS: The heart size and mediastinal contours are within normal limits.
Both lungs are clear. The visualized skeletal structures are
unremarkable.
IMPRESSION: No active cardiopulmonary disease.

## 2022-06-28 NOTE — Progress Notes (Signed)
Radiation Oncology         [phone] ________________________________  Name: [name] MRN: [id number]  Date: 06/29/2022  DOB: [birth year]  Follow-Up Visit Note  Outpatient  CC: Leilani Able, MD  Rachel Moulds, MD  Diagnosis:      ICD-10-CM   1. Ductal carcinoma in situ (DCIS) of right breast  D05.11      Stage 0 (cTis (DCIS), cN0, cM0) Right Breast UIQ, Intermediate grade DCIS, ER+ / PR+ / Her2 not assessed: s/p lumpectomy   CHIEF COMPLAINT: Here to discuss management of right breast DCIS  Narrative:  The patient returns today for follow-up.     On her breast clinic consultation date of 05/19/22, she underwent genetic testing. Results showed no clinically significant variants detected by [facility] genetic testing.   Since her consultation date, she opted to proceed with a right breast lumpectomy without nodal biopsies on 06/01/22 under the care of Dr. Magnus Ivan. Pathology from the procedure revealed: tumor size of 1.4 cm; histology of intermediate grade DCIS partially involving a sclerosing papillary lesion (negative for invasive carcinoma); all margins negative for DCIS; margin status to in situ disease of 3 mm from the posterior margin; ER status: 95% positive and PR status 90% positive, both with strong staining intensity, Her2 not assessed.  She was seen in consultation by Dr. Al Pimple during the [facility]  The role of antiestrogen therapy consisting of tamoxifen vs AI were reviewed with the patient at that time, and she is leaning towards AI given her past history of DVT. The patient will return to Dr. Al Pimple following XRT to initiate AI.   Symptomatically, the patient reports: to be doing well overall. She reports mild, intermittent breast soreness that she does not need to take anything for. She states her compression bra from surgery helps with this. She denies any issues with range of motion or lymphedema.          ALLERGIES:  is allergic to shellfish allergy and  hydrocodone, benzhydrocodone, and hydromorphone.  Meds: Current Outpatient Medications  Medication Sig Dispense Refill   carboxymethylcellulose (REFRESH PLUS) 0.5 % SOLN 1-2 drops 3 (three) times daily as needed (dry/irritated eyes.).     COLLAGEN PO Take 3 tablets by mouth daily.     lisinopril (ZESTRIL) 20 MG tablet Take 2 tablets (40 mg total) by mouth at bedtime. 60 tablet 6   metoprolol succinate (TOPROL-XL) 50 MG 24 hr tablet Take 1 tablet (50 mg total) by mouth daily. Take with or immediately following a meal. 90 tablet 3   Multiple Vitamin (MULTIVITAMIN WITH MINERALS) TABS tablet Take 1 tablet by mouth in the morning.     No current facility-administered medications for this encounter.    Physical Findings:  height is 5\' 6"  (1.676 m) and weight is 163 lb 8 oz (74.2 kg). Her temporal temperature is 96.7 F (35.9 C) (abnormal). Her blood pressure is 168/82 (abnormal) and her pulse is 78. Her respiration is 18 and oxygen saturation is 100%. .     General: Alert and oriented, in no acute distress HEENT: Head is normocephalic. Extraocular movements are intact. Oropharynx is clear. Neck: Neck is supple, no palpable cervical or supraclavicular lymphadenopathy. Heart: Regular in rate and rhythm with no murmurs, rubs, or gallops. Chest: Clear to auscultation bilaterally, with no rhonchi, wheezes, or rales. Abdomen: Soft, nontender, nondistended, with no rigidity or guarding. Extremities: No cyanosis or edema. Lymphatics: see Neck Exam Musculoskeletal: symmetric strength and muscle tone throughout. Neurologic: No obvious focalities.  Speech is fluent.  Psychiatric: Judgment and insight are intact. Affect is appropriate. Breast exam reveals surgical scar circumferential to the right nipple. Incision edges are well approximated. Surgical glue remains on top of scar. Scar tissue palpated beneath incision, no signs of infection. No other masses palpated in either breast or axilla.   Lab  Findings: Lab Results  Component Value Date   WBC 5.5 05/19/2022   HGB 12.4 05/19/2022   HCT 37.7 05/19/2022   MCV 76.3 (L) 05/19/2022   PLT 321 05/19/2022    @LASTCHEMISTRY @  Radiographic Findings: MM Outside Films Mammo  Result Date: 06/16/2022 This examination belongs to an outside facility and is stored here for comparison purposes only.  Contact the originating outside institution for any associated report or interpretation.  MM Outside Films Mammo  Result Date: 06/16/2022 This examination belongs to an outside facility and is stored here for comparison purposes only.  Contact the originating outside institution for any associated report or interpretation.   Impression/Plan:  Patient is healing very well from her lumpectomy under the care of Dr. Magnus Ivan. We discussed adjuvant radiotherapy today.  I recommend radiation therapy in order to decrease the risk of locoregional disease recurrence.  I reviewed the logistics, benefits, risks, and potential side effects of this treatment in detail. Risks may include but not necessary be limited to acute and late injury tissue in the radiation fields such as skin irritation (change in color/pigmentation, itching, dryness, pain, peeling). She may experience fatigue. We also discussed possible risk of long term cosmetic changes or scar tissue. There is also a smaller risk for lung toxicity, cardiac toxicity, brachial plexopathy, lymphedema, musculoskeletal changes, rib fragility or induction of a second malignancy, late chronic non-healing soft tissue wound.    The patient asked good questions which I answered to her satisfaction. She is enthusiastic about proceeding with treatment. A consent form has been signed and placed in her chart.    The patient will receive 40.05 Gy in 15 fractions to the right breast.  This will be followed by a boost of 10 Gy in 5 fractions to the lumpectomy cavity.   On date of service, in total, I spent 40 minutes on  this encounter. Patient was seen in person.  _____________________________________   Joyice Faster, PA-C   Lonie Peak, MD  This document serves as a record of services personally performed by Lonie Peak, MD. It was created on her behalf by Neena Rhymes, a trained medical scribe. The creation of this record is based on the scribe's personal observations and the provider's statements to them. This document has been checked and approved by the attending provider.

## 2022-06-28 NOTE — Telephone Encounter (Signed)
Rn called pt to obtain meaningful use and nurse evaluation information. Consult note completed and routed to Dr. Basilio Cairo and Quitman Livings PA. Pt doing well overall with no major concerns or questions.

## 2022-06-29 ENCOUNTER — Encounter: Payer: Self-pay | Admitting: Radiation Oncology

## 2022-06-29 ENCOUNTER — Ambulatory Visit
Admission: RE | Admit: 2022-06-29 | Discharge: 2022-06-29 | Disposition: A | Discharge status: Home / Self Care | Payer: BLUE CROSS/BLUE SHIELD | Source: Ambulatory Visit | Attending: Radiation Oncology | Admitting: Radiation Oncology

## 2022-06-29 VITALS — BP 168/82 | HR 78 | Temp 96.7°F | Resp 18 | Ht 66.0 in | Wt 163.5 lb

## 2022-06-29 DIAGNOSIS — Z170 Estrogen receptor positive status [ER+]: Secondary | ICD-10-CM | POA: Insufficient documentation

## 2022-06-29 DIAGNOSIS — D0511 Intraductal carcinoma in situ of right breast: Secondary | ICD-10-CM | POA: Diagnosis present

## 2022-06-29 DIAGNOSIS — Z510 Encounter for antineoplastic radiation therapy: Secondary | ICD-10-CM | POA: Insufficient documentation

## 2022-06-29 DIAGNOSIS — Z79899 Other long term (current) drug therapy: Secondary | ICD-10-CM | POA: Diagnosis not present

## 2022-06-29 DIAGNOSIS — Z86718 Personal history of other venous thrombosis and embolism: Secondary | ICD-10-CM | POA: Insufficient documentation

## 2022-07-06 DIAGNOSIS — D0511 Intraductal carcinoma in situ of right breast: Secondary | ICD-10-CM | POA: Diagnosis not present

## 2022-07-07 ENCOUNTER — Encounter: Payer: Self-pay | Admitting: *Deleted

## 2022-07-07 ENCOUNTER — Ambulatory Visit
Admission: RE | Admit: 2022-07-07 | Discharge: 2022-07-07 | Disposition: A | Discharge status: Home / Self Care | Payer: BLUE CROSS/BLUE SHIELD | Source: Ambulatory Visit | Attending: Radiation Oncology | Admitting: Radiation Oncology

## 2022-07-07 ENCOUNTER — Other Ambulatory Visit: Payer: Self-pay

## 2022-07-07 DIAGNOSIS — D0511 Intraductal carcinoma in situ of right breast: Secondary | ICD-10-CM

## 2022-07-07 LAB — RAD ONC ARIA SESSION SUMMARY
Course Elapsed Days: 0
Plan Fractions Treated to Date: 1
Plan Prescribed Dose Per Fraction: 2.67 Gy
Plan Total Fractions Prescribed: 15
Plan Total Prescribed Dose: 40.05 Gy
Reference Point Dosage Given to Date: 2.67 Gy
Reference Point Session Dosage Given: 2.67 Gy
Session Number: 1

## 2022-07-08 ENCOUNTER — Telehealth: Payer: Self-pay | Admitting: Hematology and Oncology

## 2022-07-08 ENCOUNTER — Ambulatory Visit
Admission: RE | Admit: 2022-07-08 | Discharge: 2022-07-08 | Disposition: A | Discharge status: Home / Self Care | Payer: BLUE CROSS/BLUE SHIELD | Source: Ambulatory Visit | Attending: Radiation Oncology | Admitting: Radiation Oncology

## 2022-07-08 ENCOUNTER — Other Ambulatory Visit: Payer: Self-pay

## 2022-07-08 DIAGNOSIS — D0511 Intraductal carcinoma in situ of right breast: Secondary | ICD-10-CM | POA: Diagnosis not present

## 2022-07-08 LAB — RAD ONC ARIA SESSION SUMMARY
Course Elapsed Days: 1
Plan Fractions Treated to Date: 2
Plan Prescribed Dose Per Fraction: 2.67 Gy
Plan Total Fractions Prescribed: 15
Plan Total Prescribed Dose: 40.05 Gy
Reference Point Dosage Given to Date: 5.34 Gy
Reference Point Session Dosage Given: 2.67 Gy
Session Number: 2

## 2022-07-08 NOTE — Telephone Encounter (Signed)
Spoke with patient confirming upcoming appointment  

## 2022-07-09 ENCOUNTER — Ambulatory Visit
Admission: RE | Admit: 2022-07-09 | Discharge: 2022-07-09 | Disposition: A | Discharge status: Home / Self Care | Payer: BLUE CROSS/BLUE SHIELD | Source: Ambulatory Visit | Attending: Radiation Oncology | Admitting: Radiation Oncology

## 2022-07-09 ENCOUNTER — Other Ambulatory Visit: Payer: Self-pay

## 2022-07-09 DIAGNOSIS — D0511 Intraductal carcinoma in situ of right breast: Secondary | ICD-10-CM | POA: Diagnosis not present

## 2022-07-09 LAB — RAD ONC ARIA SESSION SUMMARY
Course Elapsed Days: 2
Plan Fractions Treated to Date: 3
Plan Prescribed Dose Per Fraction: 2.67 Gy
Plan Total Fractions Prescribed: 15
Plan Total Prescribed Dose: 40.05 Gy
Reference Point Dosage Given to Date: 8.01 Gy
Reference Point Session Dosage Given: 2.67 Gy
Session Number: 3

## 2022-07-12 ENCOUNTER — Other Ambulatory Visit: Payer: Self-pay

## 2022-07-12 ENCOUNTER — Ambulatory Visit: Payer: BLUE CROSS/BLUE SHIELD

## 2022-07-12 ENCOUNTER — Ambulatory Visit
Admission: RE | Admit: 2022-07-12 | Discharge: 2022-07-12 | Disposition: A | Discharge status: Home / Self Care | Payer: BLUE CROSS/BLUE SHIELD | Source: Ambulatory Visit | Attending: Radiation Oncology | Admitting: Radiation Oncology

## 2022-07-12 DIAGNOSIS — D0511 Intraductal carcinoma in situ of right breast: Secondary | ICD-10-CM | POA: Diagnosis present

## 2022-07-12 LAB — RAD ONC ARIA SESSION SUMMARY
Course Elapsed Days: 5
Plan Fractions Treated to Date: 4
Plan Prescribed Dose Per Fraction: 2.67 Gy
Plan Total Fractions Prescribed: 15
Plan Total Prescribed Dose: 40.05 Gy
Reference Point Dosage Given to Date: 10.68 Gy
Reference Point Session Dosage Given: 2.67 Gy
Session Number: 4

## 2022-07-12 MED ORDER — RADIAPLEXRX EX GEL: Freq: Once | CUTANEOUS | Status: AC

## 2022-07-13 ENCOUNTER — Other Ambulatory Visit: Payer: Self-pay

## 2022-07-13 ENCOUNTER — Ambulatory Visit
Admission: RE | Admit: 2022-07-13 | Discharge: 2022-07-13 | Disposition: A | Discharge status: Home / Self Care | Payer: BLUE CROSS/BLUE SHIELD | Source: Ambulatory Visit | Attending: Radiation Oncology | Admitting: Radiation Oncology

## 2022-07-13 DIAGNOSIS — D0511 Intraductal carcinoma in situ of right breast: Secondary | ICD-10-CM | POA: Diagnosis not present

## 2022-07-13 LAB — RAD ONC ARIA SESSION SUMMARY
Course Elapsed Days: 6
Plan Fractions Treated to Date: 5
Plan Prescribed Dose Per Fraction: 2.67 Gy
Plan Total Fractions Prescribed: 15
Plan Total Prescribed Dose: 40.05 Gy
Reference Point Dosage Given to Date: 13.35 Gy
Reference Point Session Dosage Given: 2.67 Gy
Session Number: 5

## 2022-07-14 ENCOUNTER — Other Ambulatory Visit: Payer: Self-pay

## 2022-07-14 ENCOUNTER — Ambulatory Visit: Payer: BLUE CROSS/BLUE SHIELD

## 2022-07-14 ENCOUNTER — Ambulatory Visit
Admission: RE | Admit: 2022-07-14 | Discharge: 2022-07-14 | Disposition: A | Discharge status: Home / Self Care | Payer: BLUE CROSS/BLUE SHIELD | Source: Ambulatory Visit | Attending: Radiation Oncology | Admitting: Radiation Oncology

## 2022-07-14 DIAGNOSIS — D0511 Intraductal carcinoma in situ of right breast: Secondary | ICD-10-CM | POA: Diagnosis not present

## 2022-07-14 LAB — RAD ONC ARIA SESSION SUMMARY
Course Elapsed Days: 7
Plan Fractions Treated to Date: 6
Plan Prescribed Dose Per Fraction: 2.67 Gy
Plan Total Fractions Prescribed: 15
Plan Total Prescribed Dose: 40.05 Gy
Reference Point Dosage Given to Date: 16.02 Gy
Reference Point Session Dosage Given: 2.67 Gy
Session Number: 6

## 2022-07-15 ENCOUNTER — Other Ambulatory Visit: Payer: Self-pay

## 2022-07-15 ENCOUNTER — Ambulatory Visit
Admission: RE | Admit: 2022-07-15 | Discharge: 2022-07-15 | Disposition: A | Discharge status: Home / Self Care | Payer: BLUE CROSS/BLUE SHIELD | Source: Ambulatory Visit | Attending: Radiation Oncology | Admitting: Radiation Oncology

## 2022-07-15 DIAGNOSIS — D0511 Intraductal carcinoma in situ of right breast: Secondary | ICD-10-CM | POA: Diagnosis not present

## 2022-07-15 LAB — RAD ONC ARIA SESSION SUMMARY
Course Elapsed Days: 8
Plan Fractions Treated to Date: 7
Plan Prescribed Dose Per Fraction: 2.67 Gy
Plan Total Fractions Prescribed: 15
Plan Total Prescribed Dose: 40.05 Gy
Reference Point Dosage Given to Date: 18.69 Gy
Reference Point Session Dosage Given: 2.67 Gy
Session Number: 7

## 2022-07-16 ENCOUNTER — Telehealth: Payer: Self-pay | Admitting: Hematology and Oncology

## 2022-07-16 ENCOUNTER — Ambulatory Visit
Admission: RE | Admit: 2022-07-16 | Discharge: 2022-07-16 | Disposition: A | Discharge status: Home / Self Care | Payer: BLUE CROSS/BLUE SHIELD | Source: Ambulatory Visit | Attending: Radiation Oncology | Admitting: Radiation Oncology

## 2022-07-16 ENCOUNTER — Other Ambulatory Visit: Payer: Self-pay

## 2022-07-16 ENCOUNTER — Ambulatory Visit: Payer: BLUE CROSS/BLUE SHIELD

## 2022-07-16 DIAGNOSIS — D0511 Intraductal carcinoma in situ of right breast: Secondary | ICD-10-CM | POA: Diagnosis not present

## 2022-07-16 LAB — RAD ONC ARIA SESSION SUMMARY
Course Elapsed Days: 9
Plan Fractions Treated to Date: 8
Plan Prescribed Dose Per Fraction: 2.67 Gy
Plan Total Fractions Prescribed: 15
Plan Total Prescribed Dose: 40.05 Gy
Reference Point Dosage Given to Date: 21.36 Gy
Reference Point Session Dosage Given: 2.67 Gy
Session Number: 8

## 2022-07-16 NOTE — Telephone Encounter (Signed)
Scheduled appointment per scheduling message. Left voicemail.  

## 2022-07-19 ENCOUNTER — Ambulatory Visit
Admission: RE | Admit: 2022-07-19 | Discharge: 2022-07-19 | Disposition: A | Discharge status: Home / Self Care | Payer: BLUE CROSS/BLUE SHIELD | Source: Ambulatory Visit | Attending: Radiation Oncology | Admitting: Radiation Oncology

## 2022-07-19 ENCOUNTER — Other Ambulatory Visit: Payer: Self-pay

## 2022-07-19 DIAGNOSIS — D0511 Intraductal carcinoma in situ of right breast: Secondary | ICD-10-CM | POA: Diagnosis not present

## 2022-07-19 LAB — RAD ONC ARIA SESSION SUMMARY
Course Elapsed Days: 12
Plan Fractions Treated to Date: 9
Plan Prescribed Dose Per Fraction: 2.67 Gy
Plan Total Fractions Prescribed: 15
Plan Total Prescribed Dose: 40.05 Gy
Reference Point Dosage Given to Date: 24.03 Gy
Reference Point Session Dosage Given: 2.67 Gy
Session Number: 9

## 2022-07-20 ENCOUNTER — Ambulatory Visit
Admission: RE | Admit: 2022-07-20 | Discharge: 2022-07-20 | Disposition: A | Discharge status: Home / Self Care | Payer: BLUE CROSS/BLUE SHIELD | Source: Ambulatory Visit | Attending: Radiation Oncology | Admitting: Radiation Oncology

## 2022-07-20 ENCOUNTER — Other Ambulatory Visit: Payer: Self-pay

## 2022-07-20 DIAGNOSIS — D0511 Intraductal carcinoma in situ of right breast: Secondary | ICD-10-CM | POA: Diagnosis not present

## 2022-07-20 LAB — RAD ONC ARIA SESSION SUMMARY
Course Elapsed Days: 13
Plan Fractions Treated to Date: 10
Plan Prescribed Dose Per Fraction: 2.67 Gy
Plan Total Fractions Prescribed: 15
Plan Total Prescribed Dose: 40.05 Gy
Reference Point Dosage Given to Date: 26.7 Gy
Reference Point Session Dosage Given: 2.67 Gy
Session Number: 10

## 2022-07-21 ENCOUNTER — Ambulatory Visit
Admission: RE | Admit: 2022-07-21 | Discharge: 2022-07-21 | Disposition: A | Discharge status: Home / Self Care | Payer: BLUE CROSS/BLUE SHIELD | Source: Ambulatory Visit | Attending: Radiation Oncology | Admitting: Radiation Oncology

## 2022-07-21 ENCOUNTER — Other Ambulatory Visit: Payer: Self-pay

## 2022-07-21 DIAGNOSIS — D0511 Intraductal carcinoma in situ of right breast: Secondary | ICD-10-CM | POA: Diagnosis not present

## 2022-07-21 LAB — RAD ONC ARIA SESSION SUMMARY
Course Elapsed Days: 14
Plan Fractions Treated to Date: 11
Plan Prescribed Dose Per Fraction: 2.67 Gy
Plan Total Fractions Prescribed: 15
Plan Total Prescribed Dose: 40.05 Gy
Reference Point Dosage Given to Date: 29.37 Gy
Reference Point Session Dosage Given: 2.67 Gy
Session Number: 11

## 2022-07-22 ENCOUNTER — Other Ambulatory Visit: Payer: Self-pay

## 2022-07-22 ENCOUNTER — Ambulatory Visit
Admission: RE | Admit: 2022-07-22 | Discharge: 2022-07-22 | Disposition: A | Discharge status: Home / Self Care | Payer: BLUE CROSS/BLUE SHIELD | Source: Ambulatory Visit | Attending: Radiation Oncology | Admitting: Radiation Oncology

## 2022-07-22 DIAGNOSIS — D0511 Intraductal carcinoma in situ of right breast: Secondary | ICD-10-CM | POA: Diagnosis not present

## 2022-07-22 LAB — RAD ONC ARIA SESSION SUMMARY
Course Elapsed Days: 15
Plan Fractions Treated to Date: 12
Plan Prescribed Dose Per Fraction: 2.67 Gy
Plan Total Fractions Prescribed: 15
Plan Total Prescribed Dose: 40.05 Gy
Reference Point Dosage Given to Date: 32.04 Gy
Reference Point Session Dosage Given: 2.67 Gy
Session Number: 12

## 2022-07-23 ENCOUNTER — Other Ambulatory Visit: Payer: Self-pay

## 2022-07-23 ENCOUNTER — Ambulatory Visit
Admission: RE | Admit: 2022-07-23 | Discharge: 2022-07-23 | Disposition: A | Discharge status: Home / Self Care | Payer: BLUE CROSS/BLUE SHIELD | Source: Ambulatory Visit | Attending: Radiation Oncology | Admitting: Radiation Oncology

## 2022-07-23 DIAGNOSIS — D0511 Intraductal carcinoma in situ of right breast: Secondary | ICD-10-CM | POA: Diagnosis not present

## 2022-07-23 LAB — RAD ONC ARIA SESSION SUMMARY
Course Elapsed Days: 16
Plan Fractions Treated to Date: 13
Plan Prescribed Dose Per Fraction: 2.67 Gy
Plan Total Fractions Prescribed: 15
Plan Total Prescribed Dose: 40.05 Gy
Reference Point Dosage Given to Date: 34.71 Gy
Reference Point Session Dosage Given: 2.67 Gy
Session Number: 13

## 2022-07-26 ENCOUNTER — Other Ambulatory Visit: Payer: Self-pay

## 2022-07-26 ENCOUNTER — Ambulatory Visit: Payer: BLUE CROSS/BLUE SHIELD

## 2022-07-26 ENCOUNTER — Ambulatory Visit
Admission: RE | Admit: 2022-07-26 | Discharge: 2022-07-26 | Disposition: A | Discharge status: Home / Self Care | Payer: BLUE CROSS/BLUE SHIELD | Source: Ambulatory Visit | Attending: Radiation Oncology | Admitting: Radiation Oncology

## 2022-07-26 ENCOUNTER — Ambulatory Visit: Payer: BLUE CROSS/BLUE SHIELD | Admitting: Radiation Oncology

## 2022-07-26 DIAGNOSIS — D0511 Intraductal carcinoma in situ of right breast: Secondary | ICD-10-CM | POA: Diagnosis not present

## 2022-07-26 LAB — RAD ONC ARIA SESSION SUMMARY
Course Elapsed Days: 19
Plan Fractions Treated to Date: 14
Plan Prescribed Dose Per Fraction: 2.67 Gy
Plan Total Fractions Prescribed: 15
Plan Total Prescribed Dose: 40.05 Gy
Reference Point Dosage Given to Date: 37.38 Gy
Reference Point Session Dosage Given: 2.67 Gy
Session Number: 14

## 2022-07-27 ENCOUNTER — Other Ambulatory Visit: Payer: Self-pay

## 2022-07-27 ENCOUNTER — Ambulatory Visit
Admission: RE | Admit: 2022-07-27 | Discharge: 2022-07-27 | Disposition: A | Discharge status: Home / Self Care | Payer: BLUE CROSS/BLUE SHIELD | Source: Ambulatory Visit | Attending: Radiation Oncology | Admitting: Radiation Oncology

## 2022-07-27 DIAGNOSIS — D0511 Intraductal carcinoma in situ of right breast: Secondary | ICD-10-CM | POA: Diagnosis not present

## 2022-07-27 LAB — RAD ONC ARIA SESSION SUMMARY
Course Elapsed Days: 20
Plan Fractions Treated to Date: 15
Plan Prescribed Dose Per Fraction: 2.67 Gy
Plan Total Fractions Prescribed: 15
Plan Total Prescribed Dose: 40.05 Gy
Reference Point Dosage Given to Date: 40.05 Gy
Reference Point Session Dosage Given: 2.67 Gy
Session Number: 15

## 2022-07-28 ENCOUNTER — Inpatient Hospital Stay: Payer: BLUE CROSS/BLUE SHIELD | Admitting: Hematology and Oncology

## 2022-07-28 ENCOUNTER — Other Ambulatory Visit: Payer: Self-pay

## 2022-07-28 ENCOUNTER — Ambulatory Visit
Admission: RE | Admit: 2022-07-28 | Discharge: 2022-07-28 | Disposition: A | Discharge status: Home / Self Care | Payer: BLUE CROSS/BLUE SHIELD | Source: Ambulatory Visit | Attending: Radiation Oncology | Admitting: Radiation Oncology

## 2022-07-28 DIAGNOSIS — D0511 Intraductal carcinoma in situ of right breast: Secondary | ICD-10-CM | POA: Diagnosis not present

## 2022-07-28 LAB — RAD ONC ARIA SESSION SUMMARY
Course Elapsed Days: 21
Plan Fractions Treated to Date: 1
Plan Prescribed Dose Per Fraction: 2 Gy
Plan Total Fractions Prescribed: 5
Plan Total Prescribed Dose: 10 Gy
Reference Point Dosage Given to Date: 2 Gy
Reference Point Session Dosage Given: 2 Gy
Session Number: 16

## 2022-07-28 NOTE — Progress Notes (Deleted)
[facility] CONSULT NOTE  Patient Care Team: Leilani Able, MD as PCP - General (Family Medicine) Donnelly Angelica, RN as Oncology Nurse Navigator Lu Duffel, Margretta Ditty, RN as Oncology Nurse Navigator Rachel Moulds, MD as Consulting Physician (Hematology and Oncology) Abigail Miyamoto, MD as Consulting Physician (General Surgery) Lonie Peak, MD as Attending Physician (Radiation Oncology)  CHIEF COMPLAINTS/PURPOSE OF CONSULTATION:  Newly diagnosed breast cancer  HISTORY OF PRESENTING ILLNESS:  [name] 67 y.o. female is here because of recent diagnosis of right breast DCIS  I reviewed her records extensively and collaborated the history with the patient.  SUMMARY OF ONCOLOGIC HISTORY: Oncology History  Ductal carcinoma in situ (DCIS) of right breast  04/24/2022 Mammogram   Screening mammogram showed possible focal asymmetry in the right breast which is indeterminate.  Unilateral diagnostic mammogram showed 0.8 cm irregular asymmetry in the right breast.  Ultrasound confirmed the findings, size estimated to be 9 x 1.1 x 1 cm irregular mass   05/07/2022 Pathology Results   Right breast needle core biopsy showed DCIS involving a fibrotic papillary lesion, cribriform, intermediate nuclear grade, no necrosis, no calcs.  Prognostic showed ER 95% positive strong staining PR 90% positive strong staining   05/17/2022 Initial Diagnosis   Ductal carcinoma in situ (DCIS) of right breast   05/28/2022 Genetic Testing   Negative [facility] Common Hereditary Cancers +RNA Panel.  Report date is 05/28/2022.    The [facility] Common Hereditary Cancers + RNA Panel includes sequencing, deletion/duplication, and RNA analysis of the following 48 genes: APC, ATM, AXIN2, BAP1, BARD1, BMPR1A, BRCA1, BRCA2, BRIP1, CDH1, CDK4*, CDKN2A*, CHEK2, CTNNA1, DICER1, EPCAM* (del/dup only), FH, GREM1* (promoter dup analysis only), HOXB13*, KIT*, MBD4*, MEN1, MLH1, MSH2, MSH3, MSH6, MUTYH, NF1, NTHL1, PALB2,  PDGFRA*, PMS2, POLD1, POLE, PTEN, RAD51C, RAD51D, SDHA (sequencing only), SDHB, SDHC, SDHD, SMAD4, SMARCA4, STK11, TP53, TSC1, TSC2, VHL.  *Genes without RNA analysis.      Since her last visit here she had a right breast lumpectomy which showed DCIS, intermediate grade partially involving a sclerosing papillary lesion, no evidence of invasive carcinoma.  Resection margins are negative for DCIS, closest posterior margin at 3 mm prognostic showed ER 95% positive strong staining intensity, PR 90% positive strong staining intensity. She is now postradiation and is here to initiate antiestrogen therapy.  MEDICAL HISTORY:  Past Medical History:  Diagnosis Date   Allergy    Anemia    Asthma    Breast cancer (HCC)    Cataract    Chest pain    H/O hysterectomy for benign disease    Heart murmur    Hyperlipidemia    Hypertension    PE (pulmonary embolism)    Pre-diabetes    Pulmonary infarction (HCC)    Thyroid disease     SURGICAL HISTORY: Past Surgical History:  Procedure Laterality Date   ABDOMINAL HYSTERECTOMY     BREAST LUMPECTOMY WITH RADIOACTIVE SEED LOCALIZATION Right 06/01/2022   Procedure: RIGHT BREAST LUMPECTOMY WITH RADIOACTIVE SEED LOCALIZATION;  Surgeon: Abigail Miyamoto, MD;  Location: [facility] OR;  Service: General;  Laterality: Right;    SOCIAL HISTORY: Social History   Socioeconomic History   Marital status: Divorced    Spouse name: Not on file   Number of children: Not on file   Years of education: Not on file   Highest education level: Not on file  Occupational History   Not on file  Tobacco Use   Smoking status: Never   Smokeless tobacco: Never  Vaping  Use   Vaping Use: Never used  Substance and Sexual Activity   Alcohol use: No   Drug use: No   Sexual activity: Not on file  Other Topics Concern   Not on file  Social History Narrative   Not on file   Social Determinants of Health   Financial Resource Strain: Low Risk  (05/19/2022)   Overall Financial  Resource Strain (CARDIA)    Difficulty of Paying Living Expenses: Not very hard  Food Insecurity: No Food Insecurity (05/19/2022)   Hunger Vital Sign    Worried About Running Out of Food in the Last Year: Never true    Ran Out of Food in the Last Year: Never true  Transportation Needs: No Transportation Needs (05/19/2022)   PRAPARE - Administrator, Civil Service (Medical): No    Lack of Transportation (Non-Medical): No  Physical Activity: Not on file  Stress: Not on file  Social Connections: Not on file  Intimate Partner Violence: Not on file    FAMILY HISTORY: Family History  Problem Relation Age of Onset   Heart failure Mother    Diabetes Mother    Kidney disease Mother    Breast cancer Sister 42   Prostate cancer Maternal Uncle    Cancer Maternal Grandmother        unknown primary; mets; d. early 44s   Diabetes Other    Hypertension Other    Cancer Other    Asthma Other    Breast cancer Cousin        dx 92s-60s; paternal female cousin   Colon cancer Neg Hx    Esophageal cancer Neg Hx    Rectal cancer Neg Hx    Stomach cancer Neg Hx    Pancreatic cancer Neg Hx     ALLERGIES:  is allergic to shellfish allergy and hydrocodone, benzhydrocodone, and hydromorphone.  MEDICATIONS:  Current Outpatient Medications  Medication Sig Dispense Refill   carboxymethylcellulose (REFRESH PLUS) 0.5 % SOLN 1-2 drops 3 (three) times daily as needed (dry/irritated eyes.).     COLLAGEN PO Take 3 tablets by mouth daily.     lisinopril (ZESTRIL) 20 MG tablet Take 2 tablets (40 mg total) by mouth at bedtime. 60 tablet 6   metoprolol succinate (TOPROL-XL) 50 MG 24 hr tablet Take 1 tablet (50 mg total) by mouth daily. Take with or immediately following a meal. 90 tablet 3   Multiple Vitamin (MULTIVITAMIN WITH MINERALS) TABS tablet Take 1 tablet by mouth in the morning.     No current facility-administered medications for this visit.    REVIEW OF SYSTEMS:   Constitutional: Denies  fevers, chills or abnormal night sweats Eyes: Denies blurriness of vision, double vision or watery eyes Ears, nose, mouth, throat, and face: Denies mucositis or sore throat Respiratory: Denies cough, dyspnea or wheezes Cardiovascular: Denies palpitation, chest discomfort or lower extremity swelling Gastrointestinal:  Denies nausea, heartburn or change in bowel habits Skin: Denies abnormal skin rashes Lymphatics: Denies new lymphadenopathy or easy bruising Neurological:Denies numbness, tingling or new weaknesses Behavioral/Psych: Mood is stable, no new changes  Breast:  Denies any palpable lumps or discharge All other systems were reviewed with the patient and are negative.  PHYSICAL EXAMINATION: ECOG PERFORMANCE STATUS: 0 - Asymptomatic  There were no vitals filed for this visit.  There were no vitals filed for this visit.   GENERAL:alert, no distress and comfortable SKIN: skin color, texture, turgor are normal, no rashes or significant lesions EYES: normal, conjunctiva are pink  and non-injected, sclera clear OROPHARYNX:no exudate, no erythema and lips, buccal mucosa, and tongue normal  NECK: supple, thyroid normal size, non-tender, without nodularity LYMPH:  no palpable lymphadenopathy in the cervical, axillary or inguinal LUNGS: clear to auscultation and percussion with normal breathing effort HEART: regular rate & rhythm and no murmurs and no lower extremity edema ABDOMEN:abdomen soft, non-tender and normal bowel sounds Musculoskeletal:no cyanosis of digits and no clubbing  PSYCH: alert & oriented x 3 with fluent speech NEURO: no focal motor/sensory deficits BREAST: Postbiopsy changes noted.  No palpable masses or regional adenopathy  LABORATORY DATA:  I have reviewed the data as listed Lab Results  Component Value Date   WBC 5.5 05/19/2022   HGB 12.4 05/19/2022   HCT 37.7 05/19/2022   MCV 76.3 (L) 05/19/2022   PLT 321 05/19/2022   Lab Results  Component Value Date    NA 142 05/19/2022   K 4.7 05/19/2022   CL 107 05/19/2022   CO2 30 05/19/2022    RADIOGRAPHIC STUDIES: I have personally reviewed the radiological reports and agreed with the findings in the report.  ASSESSMENT AND PLAN:  No problem-specific Assessment & Plan notes found for this encounter.   Total time spent 60 minutes including history, physical exam, review of records, counseling and coordination of care All questions were answered. The patient knows to call the clinic with any problems, questions or concerns.    Rachel Moulds, MD 07/28/22

## 2022-07-29 ENCOUNTER — Ambulatory Visit
Admission: RE | Admit: 2022-07-29 | Discharge: 2022-07-29 | Disposition: A | Discharge status: Home / Self Care | Payer: BLUE CROSS/BLUE SHIELD | Source: Ambulatory Visit | Attending: Radiation Oncology | Admitting: Radiation Oncology

## 2022-07-29 ENCOUNTER — Other Ambulatory Visit: Payer: Self-pay

## 2022-07-29 DIAGNOSIS — D0511 Intraductal carcinoma in situ of right breast: Secondary | ICD-10-CM | POA: Diagnosis not present

## 2022-07-29 LAB — RAD ONC ARIA SESSION SUMMARY
Course Elapsed Days: 22
Plan Fractions Treated to Date: 2
Plan Prescribed Dose Per Fraction: 2 Gy
Plan Total Fractions Prescribed: 5
Plan Total Prescribed Dose: 10 Gy
Reference Point Dosage Given to Date: 4 Gy
Reference Point Session Dosage Given: 2 Gy
Session Number: 17

## 2022-07-30 ENCOUNTER — Ambulatory Visit: Payer: BLUE CROSS/BLUE SHIELD

## 2022-08-02 ENCOUNTER — Ambulatory Visit
Admission: RE | Admit: 2022-08-02 | Discharge: 2022-08-02 | Disposition: A | Discharge status: Home / Self Care | Payer: BLUE CROSS/BLUE SHIELD | Source: Ambulatory Visit | Attending: Radiation Oncology | Admitting: Radiation Oncology

## 2022-08-02 ENCOUNTER — Other Ambulatory Visit: Payer: Self-pay

## 2022-08-02 ENCOUNTER — Other Ambulatory Visit: Payer: Self-pay | Admitting: Internal Medicine

## 2022-08-02 DIAGNOSIS — D0511 Intraductal carcinoma in situ of right breast: Secondary | ICD-10-CM | POA: Diagnosis not present

## 2022-08-02 LAB — RAD ONC ARIA SESSION SUMMARY
Course Elapsed Days: 26
Plan Fractions Treated to Date: 3
Plan Prescribed Dose Per Fraction: 2 Gy
Plan Total Fractions Prescribed: 5
Plan Total Prescribed Dose: 10 Gy
Reference Point Dosage Given to Date: 6 Gy
Reference Point Session Dosage Given: 2 Gy
Session Number: 18

## 2022-08-03 ENCOUNTER — Ambulatory Visit
Admission: RE | Admit: 2022-08-03 | Discharge: 2022-08-03 | Disposition: A | Discharge status: Home / Self Care | Payer: BLUE CROSS/BLUE SHIELD | Source: Ambulatory Visit | Attending: Radiation Oncology | Admitting: Radiation Oncology

## 2022-08-03 ENCOUNTER — Other Ambulatory Visit: Payer: Self-pay

## 2022-08-03 ENCOUNTER — Ambulatory Visit: Payer: BLUE CROSS/BLUE SHIELD

## 2022-08-03 DIAGNOSIS — D0511 Intraductal carcinoma in situ of right breast: Secondary | ICD-10-CM | POA: Diagnosis not present

## 2022-08-03 LAB — RAD ONC ARIA SESSION SUMMARY
Course Elapsed Days: 27
Plan Fractions Treated to Date: 4
Plan Prescribed Dose Per Fraction: 2 Gy
Plan Total Fractions Prescribed: 5
Plan Total Prescribed Dose: 10 Gy
Reference Point Dosage Given to Date: 8 Gy
Reference Point Session Dosage Given: 2 Gy
Session Number: 19

## 2022-08-04 ENCOUNTER — Other Ambulatory Visit: Payer: Self-pay

## 2022-08-04 ENCOUNTER — Ambulatory Visit
Admission: RE | Admit: 2022-08-04 | Discharge: 2022-08-04 | Disposition: A | Discharge status: Home / Self Care | Payer: BLUE CROSS/BLUE SHIELD | Source: Ambulatory Visit | Attending: Radiation Oncology | Admitting: Radiation Oncology

## 2022-08-04 DIAGNOSIS — D0511 Intraductal carcinoma in situ of right breast: Secondary | ICD-10-CM | POA: Diagnosis not present

## 2022-08-04 LAB — RAD ONC ARIA SESSION SUMMARY
Course Elapsed Days: 28
Plan Fractions Treated to Date: 5
Plan Prescribed Dose Per Fraction: 2 Gy
Plan Total Fractions Prescribed: 5
Plan Total Prescribed Dose: 10 Gy
Reference Point Dosage Given to Date: 10 Gy
Reference Point Session Dosage Given: 2 Gy
Session Number: 20

## 2022-08-05 NOTE — Radiation Completion Notes (Signed)
Patient Name: [name], DISBROW MRN: [id number] Date of Birth: 1956-01-07 Referring Physician: Leilani Able, M.D. Date of Service: 2022-08-05 Radiation Oncologist: Lonie Peak, M.D. [facility]                             RADIATION ONCOLOGY END OF TREATMENT NOTE     Diagnosis: D05.11 Intraductal carcinoma in situ of right breast Staging on 2022-05-19: Ductal carcinoma in situ (DCIS) of right breast T=cTis (DCIS), N=cN0, M=cM0 Intent: Curative     ==========DELIVERED PLANS==========  First Treatment Date: 2022-07-07 - Last Treatment Date: 2022-08-04   Plan Name: Breast_R Site: Breast, Right Technique: 3D Mode: Photon Dose Per Fraction: 2.67 Gy Prescribed Dose (Delivered / Prescribed): 40.05 Gy / 40.05 Gy Prescribed Fxs (Delivered / Prescribed): 15 / 15   Plan Name: Breast_R_Bst Site: Breast, Right Technique: 3D Mode: Photon Dose Per Fraction: 2 Gy Prescribed Dose (Delivered / Prescribed): 10 Gy / 10 Gy Prescribed Fxs (Delivered / Prescribed): 5 / 5     ==========ON TREATMENT VISIT DATES========== 2022-07-12, 2022-07-14, 2022-07-26, 2022-08-02     ==========UPCOMING VISITS==========       ==========APPENDIX - ON TREATMENT VISIT NOTES==========   See weekly On Treatment Notes in Epic for details.

## 2022-08-20 NOTE — Progress Notes (Signed)
[name] presents today for follow-up after completing radiation to her right breast on  08-04-22.   Pain: pain has been minimal, intermittent tenderness and sharp shooting pains, nothing of major concern  Skin: discoloration under arm, discoloration at nipple area as well, Rn encouraged use of Vitamin E cream/ lotion  Fatigue: remains though improving  ROM: normal ROM Lymphedema: swelling under arm at times MedOnc F/U: Lillard Anes on 11-15-22 Other issues of note: Pt had no major concerns at this time related to radiation. Pt would love to give a positive review on Dr. Basilio Cairo. Rn will get that information.   Pt reports Yes No Comments  Tamoxifen []  [x]    Letrozole []  [x]    Anastrazole []  [x]    Mammogram [x]  Date:  []  Encouraged yearly mammograms

## 2022-09-02 ENCOUNTER — Telehealth: Payer: Self-pay

## 2022-09-02 ENCOUNTER — Ambulatory Visit
Admission: RE | Admit: 2022-09-02 | Discharge: 2022-09-02 | Disposition: A | Discharge status: Home / Self Care | Payer: BLUE CROSS/BLUE SHIELD | Source: Ambulatory Visit | Attending: Radiation Oncology | Admitting: Radiation Oncology

## 2022-09-02 ENCOUNTER — Encounter: Payer: Self-pay | Admitting: Radiation Oncology

## 2022-09-02 NOTE — Telephone Encounter (Signed)
RN called pt of telephone follow up. Note completed and routed to Dr. Basilio Cairo.

## 2022-09-16 ENCOUNTER — Telehealth: Payer: Self-pay | Admitting: *Deleted

## 2022-09-16 NOTE — Telephone Encounter (Signed)
Pt called this RN to follow up on appt she missed in July - note pt needs to start on an anti estrogen therapy.  Scheduled pt per cancellation tomorrow with [facility]/NP.

## 2022-09-17 ENCOUNTER — Inpatient Hospital Stay: Payer: BLUE CROSS/BLUE SHIELD | Attending: Adult Health | Admitting: Adult Health

## 2022-09-17 DIAGNOSIS — D0511 Intraductal carcinoma in situ of right breast: Secondary | ICD-10-CM

## 2022-09-17 MED ORDER — ANASTROZOLE 1 MG PO TABS
1.0000 mg | ORAL_TABLET | Freq: Every day | ORAL | 3 refills | Status: DC
Start: 2022-09-17 — End: 2023-09-04

## 2022-09-17 NOTE — Patient Instructions (Signed)
Anastrozole Tablets What is this medication? ANASTROZOLE (an AS troe zole) treats breast cancer. It works by decreasing the amount of estrogen hormone your body makes, which slows or stops breast cancer cells from spreading or growing. This medicine may be used for other purposes; ask your health care provider or pharmacist if you have questions. COMMON BRAND NAME(S): Arimidex What should I tell my care team before I take this medication? They need to know if you have any of these conditions: Bone problems Heart disease High cholesterol An unusual or allergic reaction to anastrozole, other medications, foods, dyes, or preservatives Pregnant or trying to get pregnant Breast-feeding How should I use this medication? Take this medication by mouth with a glass of water. Follow the directions on the prescription label. You can take it with or without food. If it upsets your stomach, take it with food. Take your medication at regular intervals. Do not take it more often than directed. Do not stop taking except on your care team's advice. Talk to your care team about the use of this medication in children. Special care may be needed. Overdosage: If you think you have taken too much of this medicine contact a poison control center or emergency room at once. NOTE: This medicine is only for you. Do not share this medicine with others. What if I miss a dose? If you miss a dose, take it as soon as you can. If it is almost time for your next dose, take only that dose. Do not take double or extra doses. What may interact with this medication? Estrogen and progestin hormones Tamoxifen This list may not describe all possible interactions. Give your health care provider a list of all the medicines, herbs, non-prescription drugs, or dietary supplements you use. Also tell them if you smoke, drink alcohol, or use illegal drugs. Some items may interact with your medicine. What should I watch for while using this  medication? Visit your care team for regular checks on your progress. Let your care team know about any unusual vaginal bleeding. Using this medication for a long time may weaken your bones. The risk of bone fractures may be increased. Talk to your care team about your bone health. You should make sure that you get enough calcium and vitamin D while you are taking this medication. Discuss the foods you eat and the vitamins you take with your care team. Talk to your care team if you may be pregnant. Serious birth defects can occur if you take this medication during pregnancy and for 3 weeks after the last dose. You will need a negative pregnancy test before starting this medication. Estrogen and progestin hormones may not work as well while you are taking this medication. Contraception is recommended while taking this medication and for 3 weeks after the last dose. Your care team can help you find the option that works for you. Do not breastfeed while taking this medication and for 2 weeks after the last dose. This medication may cause infertility. Talk with your care team if you are concerned about your fertility. What side effects may I notice from receiving this medication? Side effects that you should report to your care team as soon as possible: Allergic reactions--skin rash, itching, hives, swelling of the face, lips, tongue, or throat Side effects that usually do not require medical attention (report to your care team if they continue or are bothersome): Bone, joint, or muscle pain Headache Hot flashes Nausea Sore throat Swelling of the ankles,  hands, or feet Unusual weakness or fatigue This list may not describe all possible side effects. Call your doctor for medical advice about side effects. You may report side effects to FDA at 1-800-FDA-1088. Where should I keep my medication? Keep out of the reach of children and pets. Store at room temperature between 20 and 25 degrees C (68 and 77  degrees F). Get rid of any unused medication after the expiration date. To get rid of medications that are no longer wanted or have expired: Take the medication to a medication take-back program. Check with your pharmacy or law enforcement to find a location. If you cannot return the medication, check the label or package insert to see if the medication should be thrown out in the garbage or flushed down the toilet. If you are not sure, ask your care team. If it is safe to put it in the trash, empty the medication out of the container. Mix the medication with cat litter, dirt, coffee grounds, or other unwanted substance. Seal the mixture in a bag or container. Put it in the trash. NOTE: This sheet is a summary. It may not cover all possible information. If you have questions about this medicine, talk to your doctor, pharmacist, or health care provider.  2024 Elsevier/Gold Standard (2021-06-12 00:00:00)

## 2022-09-17 NOTE — Assessment & Plan Note (Signed)
[name] is a 67 year old woman with history of stage 0 right breast DCIS diagnosed in April 2024 status postlumpectomy and adjuvant radiation following up today to discuss beginning antiestrogen therapy.  [name] and I discussed her DCIS the fact that it is ER/PR positive and antiestrogen therapy recommendations.  Since she has a history of pulmonary embolus she is not a candidate to take tamoxifen.  Instead we will prescribe anastrozole for her to take daily for 5 years.  I reviewed the risks and benefits in detail and she is agreeable to proceed.  We will reach out to her primary care provider to understand when her most recent bone density testing was completed.  I sent a handout on anastrozole in her after visit summary.  [name] will return in 8 weeks for her survivorship care plan visit.  I reviewed with her in detail what this entails.  She verbalized understanding.  She knows to call for any questions or concerns that may arise between now and her next visit with [place].

## 2022-09-17 NOTE — Progress Notes (Signed)
[facility] Cancer Follow up:    Leilani Able, MD [address] [place] [address]   DIAGNOSIS:  Cancer Staging  Ductal carcinoma in situ (DCIS) of right breast Staging form: Breast, AJCC 8th Edition - Clinical stage from 05/19/2022: Stage 0 (cTis (DCIS), cN0, cM0, ER+, PR+) - Unsigned Stage prefix: Initial diagnosis Nuclear grade: G2 I connected with [name] on 09/17/22 at 10:45 AM EDT by telephone and verified that I am speaking with the correct person using two identifiers.  I discussed the limitations, risks, security and privacy concerns of performing an evaluation and management service by telephone and the availability of in person appointments.  I also discussed with the patient that there may be a patient responsible charge related to this service. The patient expressed understanding and agreed to proceed.   Patient location: home Provider location: [facility] office Others participating: none  SUMMARY OF ONCOLOGIC HISTORY: Oncology History  Ductal carcinoma in situ (DCIS) of right breast  04/24/2022 Mammogram   Screening mammogram showed possible focal asymmetry in the right breast which is indeterminate.  Unilateral diagnostic mammogram showed 0.8 cm irregular asymmetry in the right breast.  Ultrasound confirmed the findings, size estimated to be 9 x 1.1 x 1 cm irregular mass   05/07/2022 Pathology Results   Right breast needle core biopsy showed DCIS involving a fibrotic papillary lesion, cribriform, intermediate nuclear grade, no necrosis, no calcs.  Prognostic showed ER 95% positive strong staining PR 90% positive strong staining   05/28/2022 Genetic Testing   Negative [facility] Common Hereditary Cancers +RNA Panel.  Report date is 05/28/2022.    The [employer] Common Hereditary Cancers + RNA Panel includes sequencing, deletion/duplication, and RNA analysis of the following 48 genes: APC, ATM, AXIN2, BAP1, BARD1, BMPR1A, BRCA1, BRCA2, BRIP1, CDH1, CDK4*,  CDKN2A*, CHEK2, CTNNA1, DICER1, EPCAM* (del/dup only), FH, GREM1* (promoter dup analysis only), HOXB13*, KIT*, MBD4*, MEN1, MLH1, MSH2, MSH3, MSH6, MUTYH, NF1, NTHL1, PALB2, PDGFRA*, PMS2, POLD1, POLE, PTEN, RAD51C, RAD51D, SDHA (sequencing only), SDHB, SDHC, SDHD, SMAD4, SMARCA4, STK11, TP53, TSC1, TSC2, VHL.  *Genes without RNA analysis.     06/01/2022 Surgery   Right breast lumpectomy: DCIS, 1.4 cm, intermediate grade, margins negative.   07/07/2022 - 08/04/2022 Radiation Therapy   Plan Name: Breast_R Site: Breast, Right Technique: 3D Mode: Photon Dose Per Fraction: 2.67 Gy Prescribed Dose (Delivered / Prescribed): 40.05 Gy / 40.05 Gy Prescribed Fxs (Delivered / Prescribed): 15 / 15   Plan Name: Breast_R_Bst Site: Breast, Right Technique: 3D Mode: Photon Dose Per Fraction: 2 Gy Prescribed Dose (Delivered / Prescribed): 10 Gy / 10 Gy Prescribed Fxs (Delivered / Prescribed): 5 / 5     CURRENT THERAPY: s/p radiation therapy  INTERVAL HISTORY: MARZETTA LOFT 67 y.o. female returns for f/u after finishing her radiation therapy.  She denies any breast issues, and has occasional pain, but these are fleeting.    She is interested in talking about antiestrogen therapy and getting this started.   Patient Active Problem List   Diagnosis Date Noted   Genetic testing 06/10/2022   Ductal carcinoma in situ (DCIS) of right breast 05/17/2022   Mixed hyperlipidemia 12/16/2021   Hypertension 12/16/2021   History of pulmonary embolus (PE) 12/16/2021   PE (pulmonary embolism) 07/21/2011   Chest pain 07/21/2011   Pulmonary infarction (HCC) 05/09/2011   Pulmonary embolism (HCC) 05/09/2011   H/O hysterectomy for benign disease 05/09/2011   Thyroid disease 05/09/2011    is allergic to shellfish allergy  and hydrocodone, benzhydrocodone, and hydromorphone.  MEDICAL HISTORY: Past Medical History:  Diagnosis Date   Allergy    Anemia    Asthma    Breast cancer (HCC)    Cataract    Chest  pain    H/O hysterectomy for benign disease    Heart murmur    Hyperlipidemia    Hypertension    PE (pulmonary embolism)    Pre-diabetes    Pulmonary infarction (HCC)    Thyroid disease     SURGICAL HISTORY: Past Surgical History:  Procedure Laterality Date   ABDOMINAL HYSTERECTOMY     BREAST LUMPECTOMY WITH RADIOACTIVE SEED LOCALIZATION Right 06/01/2022   Procedure: RIGHT BREAST LUMPECTOMY WITH RADIOACTIVE SEED LOCALIZATION;  Surgeon: Abigail Miyamoto, MD;  Location: [facility] OR;  Service: General;  Laterality: Right;    SOCIAL HISTORY: Social History   Socioeconomic History   Marital status: Divorced    Spouse name: Not on file   Number of children: Not on file   Years of education: Not on file   Highest education level: Not on file  Occupational History   Not on file  Tobacco Use   Smoking status: Never   Smokeless tobacco: Never  Vaping Use   Vaping status: Never Used  Substance and Sexual Activity   Alcohol use: No   Drug use: No   Sexual activity: Not on file  Other Topics Concern   Not on file  Social History Narrative   Not on file   Social Determinants of Health   Financial Resource Strain: Low Risk  (05/19/2022)   Overall Financial Resource Strain (CARDIA)    Difficulty of Paying Living Expenses: Not very hard  Food Insecurity: No Food Insecurity (05/19/2022)   Hunger Vital Sign    Worried About Running Out of Food in the Last Year: Never true    Ran Out of Food in the Last Year: Never true  Transportation Needs: No Transportation Needs (05/19/2022)   PRAPARE - Administrator, Civil Service (Medical): No    Lack of Transportation (Non-Medical): No  Physical Activity: Not on file  Stress: Not on file  Social Connections: Not on file  Intimate Partner Violence: Not on file    FAMILY HISTORY: Family History  Problem Relation Age of Onset   Heart failure Mother    Diabetes Mother    Kidney disease Mother    Breast cancer Sister 15    Prostate cancer Maternal Uncle    Cancer Maternal Grandmother        unknown primary; mets; d. early 85s   Diabetes Other    Hypertension Other    Cancer Other    Asthma Other    Breast cancer Cousin        dx 53s-60s; paternal female cousin   Colon cancer Neg Hx    Esophageal cancer Neg Hx    Rectal cancer Neg Hx    Stomach cancer Neg Hx    Pancreatic cancer Neg Hx     Review of Systems  Constitutional:  Negative for appetite change, chills, fatigue, fever and unexpected weight change.  HENT:   Negative for hearing loss, lump/mass and trouble swallowing.   Eyes:  Negative for eye problems and icterus.  Respiratory:  Negative for chest tightness, cough and shortness of breath.   Cardiovascular:  Negative for chest pain, leg swelling and palpitations.  Gastrointestinal:  Negative for abdominal distention, abdominal pain, constipation, diarrhea, nausea and vomiting.  Endocrine: Negative for hot flashes.  Genitourinary:  Negative for difficulty urinating.   Musculoskeletal:  Negative for arthralgias.  Skin:  Negative for itching and rash.  Neurological:  Negative for dizziness, extremity weakness, headaches and numbness.  Hematological:  Negative for adenopathy. Does not bruise/bleed easily.  Psychiatric/Behavioral:  Negative for depression. The patient is not nervous/anxious.       PHYSICAL EXAMINATION Patient sounds well, in no apparent distress, mood and behavior are normal.     ASSESSMENT and THERAPY PLAN:   Ductal carcinoma in situ (DCIS) of right breast Airin is a 67 year old woman with history of stage 0 right breast DCIS diagnosed in April 2024 status postlumpectomy and adjuvant radiation following up today to discuss beginning antiestrogen therapy.  Chanda and I discussed her DCIS the fact that it is ER/PR positive and antiestrogen therapy recommendations.  Since she has a history of pulmonary embolus she is not a candidate to take tamoxifen.  Instead we will  prescribe anastrozole for her to take daily for 5 years.  I reviewed the risks and benefits in detail and she is agreeable to proceed.  We will reach out to her primary care provider to understand when her most recent bone density testing was completed.  I sent a handout on anastrozole in her after visit summary.  [name] will return in 8 weeks for her survivorship care plan visit.  I reviewed with her in detail what this entails.  She verbalized understanding.  She knows to call for any questions or concerns that may arise between now and her next visit with [place].   Follow up instructions:    -Return to cancer center in 8 weeks for SCP visit     The patient was provided an opportunity to ask questions and all were answered. The patient agreed with the plan and demonstrated an understanding of the instructions.   The patient was advised to call back or seek an in-person evaluation if the symptoms worsen or if the condition fails to improve as anticipated.   I provided 15 minutes of non face-to-face telephone visit time during this encounter, and > 50% was spent counseling as documented under my assessment & plan.   Lillard Anes, NP 09/17/22 11:42 AM Medical Oncology and Hematology [facility] [address] [place], [place] [address] Tel. [phone]    Fax. [phone]

## 2022-11-15 ENCOUNTER — Inpatient Hospital Stay: Payer: BLUE CROSS/BLUE SHIELD | Attending: Adult Health | Admitting: Adult Health

## 2022-11-15 ENCOUNTER — Encounter: Payer: Self-pay | Admitting: Adult Health

## 2022-11-15 VITALS — BP 163/91 | HR 80 | Temp 97.9°F | Resp 18 | Ht 66.0 in | Wt 169.6 lb

## 2022-11-15 DIAGNOSIS — D0511 Intraductal carcinoma in situ of right breast: Secondary | ICD-10-CM | POA: Insufficient documentation

## 2022-11-15 DIAGNOSIS — Z13820 Encounter for screening for osteoporosis: Secondary | ICD-10-CM | POA: Diagnosis not present

## 2022-11-15 DIAGNOSIS — Z923 Personal history of irradiation: Secondary | ICD-10-CM | POA: Diagnosis not present

## 2022-11-15 DIAGNOSIS — Z79811 Long term (current) use of aromatase inhibitors: Secondary | ICD-10-CM | POA: Insufficient documentation

## 2022-11-15 NOTE — Progress Notes (Signed)
SURVIVORSHIP VISIT:  BRIEF ONCOLOGIC HISTORY:  Oncology History  Ductal carcinoma in situ (DCIS) of right breast  04/24/2022 Mammogram   Screening mammogram showed possible focal asymmetry in the right breast which is indeterminate.  Unilateral diagnostic mammogram showed 0.8 cm irregular asymmetry in the right breast.  Ultrasound confirmed the findings, size estimated to be 9 x 1.1 x 1 cm irregular mass   05/07/2022 Pathology Results   Right breast needle core biopsy showed DCIS involving a fibrotic papillary lesion, cribriform, intermediate nuclear grade, no necrosis, no calcs.  Prognostic showed ER 95% positive strong staining PR 90% positive strong staining   05/28/2022 Genetic Testing   Negative [facility] Common Hereditary Cancers +RNA Panel.  Report date is 05/28/2022.    The [employer] Common Hereditary Cancers + RNA Panel includes sequencing, deletion/duplication, and RNA analysis of the following 48 genes: APC, ATM, AXIN2, BAP1, BARD1, BMPR1A, BRCA1, BRCA2, BRIP1, CDH1, CDK4*, CDKN2A*, CHEK2, CTNNA1, DICER1, EPCAM* (del/dup only), FH, GREM1* (promoter dup analysis only), HOXB13*, KIT*, MBD4*, MEN1, MLH1, MSH2, MSH3, MSH6, MUTYH, NF1, NTHL1, PALB2, PDGFRA*, PMS2, POLD1, POLE, PTEN, RAD51C, RAD51D, SDHA (sequencing only), SDHB, SDHC, SDHD, SMAD4, SMARCA4, STK11, TP53, TSC1, TSC2, VHL.  *Genes without RNA analysis.     06/01/2022 Surgery   Right breast lumpectomy: DCIS, 1.4 cm, intermediate grade, margins negative.   06/01/2022 Cancer Staging   Staging form: Breast, AJCC 8th Edition - Pathologic stage from 06/01/2022: Stage 0 (pTis (DCIS), pN0, cM0, ER+, PR+) - Signed by Loa Socks, NP on 11/13/2022 Stage prefix: Initial diagnosis   07/07/2022 - 08/04/2022 Radiation Therapy   Plan Name: Breast_R Site: Breast, Right Technique: 3D Mode: Photon Dose Per Fraction: 2.67 Gy Prescribed Dose (Delivered / Prescribed): 40.05 Gy / 40.05 Gy Prescribed Fxs (Delivered / Prescribed): 15 / 15    Plan Name: Breast_R_Bst Site: Breast, Right Technique: 3D Mode: Photon Dose Per Fraction: 2 Gy Prescribed Dose (Delivered / Prescribed): 10 Gy / 10 Gy Prescribed Fxs (Delivered / Prescribed): 5 / 5   09/2022 -  Anti-estrogen oral therapy   Anastrozole x 5 years     INTERVAL HISTORY:  [name] to review her survivorship care plan detailing her treatment course for breast cancer, as well as monitoring long-term side effects of that treatment, education regarding health maintenance, screening, and overall wellness and health promotion.     Overall, [name] reports feeling quite well.  She is taking all daily and tolerates it moderately well.  She does experience intermittent hot flashes.  She also has some residual fatigue and notes that she snores at night and does wake up in the middle of the night.  REVIEW OF SYSTEMS:  Review of Systems  Constitutional:  Positive for fatigue. Negative for appetite change, chills, fever and unexpected weight change.  HENT:   Negative for hearing loss, lump/mass and trouble swallowing.   Eyes:  Negative for eye problems and icterus.  Respiratory:  Negative for chest tightness, cough and shortness of breath.   Cardiovascular:  Negative for chest pain, leg swelling and palpitations.  Gastrointestinal:  Negative for abdominal distention, abdominal pain, constipation, diarrhea, nausea and vomiting.  Endocrine: Positive for hot flashes.  Genitourinary:  Negative for difficulty urinating.   Musculoskeletal:  Negative for arthralgias.  Skin:  Negative for itching and rash.  Neurological:  Negative for dizziness, extremity weakness, headaches and numbness.  Hematological:  Negative for adenopathy. Does not bruise/bleed easily.  Psychiatric/Behavioral:  Positive for sleep disturbance. Negative for depression. The patient is not  nervous/anxious.    Breast: Denies any new nodularity, masses, tenderness, nipple changes, or nipple discharge.       PAST  MEDICAL/SURGICAL HISTORY:  Past Medical History:  Diagnosis Date   Allergy    Anemia    Asthma    Breast cancer (HCC)    Cataract    Chest pain    H/O hysterectomy for benign disease    Heart murmur    Hyperlipidemia    Hypertension    PE (pulmonary embolism)    Pre-diabetes    Pulmonary infarction (HCC)    Thyroid disease    Past Surgical History:  Procedure Laterality Date   ABDOMINAL HYSTERECTOMY     BREAST LUMPECTOMY WITH RADIOACTIVE SEED LOCALIZATION Right 06/01/2022   Procedure: RIGHT BREAST LUMPECTOMY WITH RADIOACTIVE SEED LOCALIZATION;  Surgeon: Abigail Miyamoto, MD;  Location: [facility] OR;  Service: General;  Laterality: Right;     ALLERGIES:  Allergies  Allergen Reactions   Shellfish Allergy Anaphylaxis   Hydrocodone, Benzhydrocodone, And Hydromorphone Other (See Comments)    Sweating per patient     CURRENT MEDICATIONS:  Outpatient Encounter Medications as of 11/15/2022  Medication Sig   anastrozole (ARIMIDEX) 1 MG tablet Take 1 tablet (1 mg total) by mouth daily.   carboxymethylcellulose (REFRESH PLUS) 0.5 % SOLN 1-2 drops 3 (three) times daily as needed (dry/irritated eyes.).   COLLAGEN PO Take 3 tablets by mouth daily.   lisinopril (ZESTRIL) 20 MG tablet TAKE 1 TABLET BY MOUTH AT BEDTIME   metoprolol succinate (TOPROL-XL) 50 MG 24 hr tablet Take 1 tablet (50 mg total) by mouth daily. Take with or immediately following a meal.   Multiple Vitamin (MULTIVITAMIN WITH MINERALS) TABS tablet Take 1 tablet by mouth in the morning.   No facility-administered encounter medications on file as of 11/15/2022.     ONCOLOGIC FAMILY HISTORY:  Family History  Problem Relation Age of Onset   Heart failure Mother    Diabetes Mother    Kidney disease Mother    Breast cancer Sister 60   Prostate cancer Maternal Uncle    Cancer Maternal Grandmother        unknown primary; mets; d. early 40s   Diabetes Other    Hypertension Other    Cancer Other    Asthma Other    Breast  cancer Cousin        dx 23s-60s; paternal female cousin   Colon cancer Neg Hx    Esophageal cancer Neg Hx    Rectal cancer Neg Hx    Stomach cancer Neg Hx    Pancreatic cancer Neg Hx      SOCIAL HISTORY:  Social History   Socioeconomic History   Marital status: Divorced    Spouse name: Not on file   Number of children: Not on file   Years of education: Not on file   Highest education level: Not on file  Occupational History   Not on file  Tobacco Use   Smoking status: Never   Smokeless tobacco: Never  Vaping Use   Vaping status: Never Used  Substance and Sexual Activity   Alcohol use: No   Drug use: No   Sexual activity: Not on file  Other Topics Concern   Not on file  Social History Narrative   Not on file   Social Determinants of Health   Financial Resource Strain: Low Risk  (05/19/2022)   Overall Financial Resource Strain (CARDIA)    Difficulty of Paying Living Expenses: Not very hard  Food Insecurity: No Food Insecurity (05/19/2022)   Hunger Vital Sign    Worried About Running Out of Food in the Last Year: Never true    Ran Out of Food in the Last Year: Never true  Transportation Needs: No Transportation Needs (05/19/2022)   PRAPARE - Administrator, Civil Service (Medical): No    Lack of Transportation (Non-Medical): No  Physical Activity: Not on file  Stress: Not on file  Social Connections: Not on file  Intimate Partner Violence: Not on file     OBSERVATIONS/OBJECTIVE:  BP (!) 163/91 (BP Location: Right Arm, Patient Position: Sitting) Comment: 177/85 RA  Pulse 80   Temp 97.9 F (36.6 C) (Tympanic)   Resp 18   Ht 5\' 6"  (1.676 m)   Wt 169 lb 9.6 oz (76.9 kg)   SpO2 100%   BMI 27.37 kg/m  GENERAL: Patient is a well appearing female in no acute distress HEENT:  Sclerae anicteric.  Oropharynx clear and moist. No ulcerations or evidence of oropharyngeal candidiasis. Neck is supple.  NODES:  No cervical, supraclavicular, or axillary  lymphadenopathy palpated.  BREAST EXAM: Right breast status postlumpectomy and radiation no sign of local recurrence left breast is benign. LUNGS:  Clear to auscultation bilaterally.  No wheezes or rhonchi. HEART:  Regular rate and rhythm. No murmur appreciated. ABDOMEN:  Soft, nontender.  Positive, normoactive bowel sounds. No organomegaly palpated. MSK:  No focal spinal tenderness to palpation. Full range of motion bilaterally in the upper extremities. EXTREMITIES:  No peripheral edema.   SKIN:  Clear with no obvious rashes or skin changes. No nail dyscrasia. NEURO:  Nonfocal. Well oriented.  Appropriate affect.   LABORATORY DATA:  None for this visit.  DIAGNOSTIC IMAGING:  None for this visit.      ASSESSMENT AND PLAN:  Ms.. Hester is a pleasant 67 y.o. female with Stage 0 right breast DCIS, ER+/PR+, diagnosed in 05/2022, treated with lumpectomy, adjuvant radiation therapy, and anti-estrogen therapy with anastrozole beginning in 09/2022.  She presents to the Survivorship Clinic for our initial meeting and routine follow-up post-completion of treatment for breast cancer.    1. Stage 0 right breast cancer:  [name] is continuing to recover from definitive treatment for breast cancer. She will follow-up with her medical oncologist, Dr.  Al Pimple in 6 months with history and physical exam per surveillance protocol.  She will continue her anti-estrogen therapy with anastrozole. Thus far, she is tolerating the anastrozole well, with minimal side effects. Her mammogram is due 04/2023; orders placed today.   Today, a comprehensive survivorship care plan and treatment summary was reviewed with the patient today detailing her breast cancer diagnosis, treatment course, potential late/long-term effects of treatment, appropriate follow-up care with recommendations for the future, and patient education resources.  A copy of this summary, along with a letter will be sent to the patient's primary care  provider via mail/fax/In Basket message after today's visit.    2.  Sleep pattern disturbance: I suggested she consider reaching out to her primary care provider to understand if she needs to undergo any sleep evaluation or sleep study due to her snoring.  3. Bone health:  Given [name]'s age/history of breast cancer and her current treatment regimen including anti-estrogen therapy with anastrozole, she is at risk for bone demineralization.  He has not undergone bone density testing so I placed orders for [facility] to occur [facility] in the next few weeks.  She was given education  on specific activities to promote bone health.  4. Cancer screening:  Due to [name]'s history and her age, she should receive screening for skin cancers, colon cancer.  The information and recommendations are listed on the patient's comprehensive care plan/treatment summary and were reviewed in detail with the patient.    5. Health maintenance and wellness promotion: [name] was encouraged to consume 5-7 servings of fruits and vegetables per day. We reviewed the "Nutrition Rainbow" handout.  She was also encouraged to engage in moderate to vigorous exercise for 30 minutes per day most days of the week.  She was instructed to limit her alcohol consumption and continue to abstain from tobacco use.     6. Support services/counseling: It is not uncommon for this period of the patient's cancer care trajectory to be one of many emotions and stressors.   She was given information regarding our available services and encouraged to contact me with any questions or for help enrolling in any of our support group/programs.    Follow up instructions:    -Return to cancer center in 6 months for follow-up with Dr. Al Pimple -Mammogram due in 04/2023 -Dexa ordered -She is welcome to return back to the Survivorship Clinic at any time; no additional follow-up needed at this time.  -Consider referral back to survivorship as  a long-term survivor for continued surveillance  The patient was provided an opportunity to ask questions and all were answered. The patient agreed with the plan and demonstrated an understanding of the instructions.   Total encounter time:40 minutes*in face-to-face visit time, chart review, lab review, care coordination, order entry, and documentation of the encounter time.    Lillard Anes, NP 11/15/22 4:02 PM Medical Oncology and Hematology [facility] [address] [place], [place] [address] Tel. [phone]    Fax. [phone]  *Total Encounter Time as defined by the Centers for Medicare and Medicaid Services includes, in addition to the face-to-face time of a patient visit (documented in the note above) non-face-to-face time: obtaining and reviewing outside history, ordering and reviewing medications, tests or procedures, care coordination (communications with other health care professionals or caregivers) and documentation in the medical record.

## 2022-11-17 ENCOUNTER — Emergency Department (HOSPITAL_BASED_OUTPATIENT_CLINIC_OR_DEPARTMENT_OTHER)
Admission: EM | Admit: 2022-11-17 | Discharge: 2022-11-17 | Disposition: A | Discharge status: Home / Self Care | Payer: BLUE CROSS/BLUE SHIELD | Attending: Emergency Medicine | Admitting: Emergency Medicine

## 2022-11-17 ENCOUNTER — Encounter (HOSPITAL_BASED_OUTPATIENT_CLINIC_OR_DEPARTMENT_OTHER): Payer: Self-pay | Admitting: Pediatrics

## 2022-11-17 ENCOUNTER — Telehealth: Payer: Self-pay | Admitting: Adult Health

## 2022-11-17 ENCOUNTER — Other Ambulatory Visit: Payer: Self-pay

## 2022-11-17 DIAGNOSIS — R519 Headache, unspecified: Secondary | ICD-10-CM | POA: Insufficient documentation

## 2022-11-17 DIAGNOSIS — R42 Dizziness and giddiness: Secondary | ICD-10-CM | POA: Diagnosis present

## 2022-11-17 DIAGNOSIS — D0511 Intraductal carcinoma in situ of right breast: Secondary | ICD-10-CM | POA: Diagnosis not present

## 2022-11-17 LAB — URINALYSIS, ROUTINE W REFLEX MICROSCOPIC
Bilirubin Urine: NEGATIVE
Glucose, UA: NEGATIVE mg/dL
Hgb urine dipstick: NEGATIVE
Ketones, ur: NEGATIVE mg/dL
Nitrite: NEGATIVE
Protein, ur: NEGATIVE mg/dL
Specific Gravity, Urine: 1.01 (ref 1.005–1.030)
pH: 5.5 (ref 5.0–8.0)

## 2022-11-17 LAB — CBC
HCT: 37.1 % (ref 36.0–46.0)
Hemoglobin: 12.1 g/dL (ref 12.0–15.0)
MCH: 24.8 pg — ABNORMAL LOW (ref 26.0–34.0)
MCHC: 32.6 g/dL (ref 30.0–36.0)
MCV: 76 fL — ABNORMAL LOW (ref 80.0–100.0)
Platelets: 303 10*3/uL (ref 150–400)
RBC: 4.88 MIL/uL (ref 3.87–5.11)
RDW: 14.7 % (ref 11.5–15.5)
WBC: 5.3 10*3/uL (ref 4.0–10.5)
nRBC: 0 % (ref 0.0–0.2)

## 2022-11-17 LAB — I-STAT CHEM 8, ED
BUN: 11 mg/dL (ref 8–23)
Calcium, Ion: 1.22 mmol/L (ref 1.15–1.40)
Chloride: 105 mmol/L (ref 98–111)
Creatinine, Ser: 1.1 mg/dL — ABNORMAL HIGH (ref 0.44–1.00)
Glucose, Bld: 93 mg/dL (ref 70–99)
HCT: 38 % (ref 36.0–46.0)
Hemoglobin: 12.9 g/dL (ref 12.0–15.0)
Potassium: 4 mmol/L (ref 3.5–5.1)
Sodium: 142 mmol/L (ref 135–145)
TCO2: 25 mmol/L (ref 22–32)

## 2022-11-17 LAB — CBG MONITORING, ED: Glucose-Capillary: 85 mg/dL (ref 70–99)

## 2022-11-17 LAB — URINALYSIS, MICROSCOPIC (REFLEX): RBC / HPF: NONE SEEN RBC/hpf (ref 0–5)

## 2022-11-17 MED ORDER — MECLIZINE HCL 25 MG PO TABS: 25.0000 mg | ORAL_TABLET | Freq: Three times a day (TID) | ORAL | 0 refills | Status: DC | PRN

## 2022-11-17 MED ORDER — MECLIZINE HCL 25 MG PO TABS
25.0000 mg | ORAL_TABLET | Freq: Once | ORAL | Status: AC
  Administered 2022-11-17: 25 mg via ORAL
  Filled 2022-11-17: qty 1

## 2022-11-17 MED ORDER — DEXAMETHASONE 4 MG PO TABS
10.0000 mg | ORAL_TABLET | Freq: Once | ORAL | Status: AC
  Administered 2022-11-17: 10 mg via ORAL
  Filled 2022-11-17: qty 3

## 2022-11-17 NOTE — ED Notes (Signed)

## 2022-11-17 NOTE — Telephone Encounter (Signed)
Per [facility] LOS 10/7 Patient is aware of scheduled appointment times/dates for follow up visit

## 2022-11-17 NOTE — ED Provider Notes (Signed)
[place] [facility] Provider Note   CSN: [id number] Arrival date & time: 11/17/22  1729     History  Chief Complaint  Patient presents with   Dizziness    [name] is a 67 y.o. female.  67 yo F with a cc of dizziness.  Says this feels like the room is spinning.  She had episodes like this in the past that were told her vertigo.  She can get better on their own.  This started yesterday.  Worse with head movement and better when she holds still.  She has a little bit of a headache and neck pain with this but nothing she is concerned about.  She has been a bit congested and had a cough.  No ear pain.  No one-sided numbness or weakness no difficulty speech or swallowing.  Has had this on previous episodes it was so bad that she could not walk but has had no issues with ambulation.   Dizziness      Home Medications Prior to Admission medications   Medication Sig Start Date End Date Taking? Authorizing Provider  meclizine (ANTIVERT) 25 MG tablet Take 1 tablet (25 mg total) by mouth 3 (three) times daily as needed for dizziness. 11/17/22  Yes Melene Plan, DO  anastrozole (ARIMIDEX) 1 MG tablet Take 1 tablet (1 mg total) by mouth daily. 09/17/22   Loa Socks, NP  carboxymethylcellulose (REFRESH PLUS) 0.5 % SOLN 1-2 drops 3 (three) times daily as needed (dry/irritated eyes.).    [provider]  COLLAGEN PO Take 3 tablets by mouth daily.    [provider]  lisinopril (ZESTRIL) 20 MG tablet TAKE 1 TABLET BY MOUTH AT BEDTIME 08/02/22   Tolia, Sunit, DO  metoprolol succinate (TOPROL-XL) 50 MG 24 hr tablet Take 1 tablet (50 mg total) by mouth daily. Take with or immediately following a meal. 12/16/21 05/23/24  Custovic, Rozell Searing, DO  Multiple Vitamin (MULTIVITAMIN WITH MINERALS) TABS tablet Take 1 tablet by mouth in the morning.    [provider]      Allergies    Shellfish allergy and Hydrocodone,  benzhydrocodone, and hydromorphone    Review of Systems   Review of Systems  Neurological:  Positive for dizziness.    Physical Exam Updated Vital Signs BP (!) 185/94 (BP Location: Left Arm)   Pulse 85   Temp 97.7 F (36.5 C)   Resp 18   Ht 5' 6.5" (1.689 m)   Wt 76.7 kg   SpO2 100%   BMI 26.87 kg/m  Physical Exam Vitals and nursing note reviewed.  Constitutional:      General: She is not in acute distress.    Appearance: She is well-developed. She is not diaphoretic.  HENT:     Head: Normocephalic and atraumatic.  Eyes:     Pupils: Pupils are equal, round, and reactive to light.  Cardiovascular:     Rate and Rhythm: Normal rate and regular rhythm.     Heart sounds: No murmur heard.    No friction rub. No gallop.  Pulmonary:     Effort: Pulmonary effort is normal.     Breath sounds: No wheezing or rales.  Abdominal:     General: There is no distension.     Palpations: Abdomen is soft.     Tenderness: There is no abdominal tenderness.  Musculoskeletal:        General: No tenderness.     Cervical back: Normal range  of motion and neck supple.  Skin:    General: Skin is warm and dry.  Neurological:     Mental Status: She is alert and oriented to person, place, and time.     Cranial Nerves: Cranial nerves 2-12 are intact.     Sensory: Sensation is intact.     Motor: Motor function is intact.     Coordination: Coordination is intact.     Comments: Benign neurologic exam.  Ambulates without issue.  No appreciable nystagmus  Psychiatric:        Behavior: Behavior normal.     ED Results / Procedures / Treatments   Labs (all labs ordered are listed, but only abnormal results are displayed) Labs Reviewed  CBC - Abnormal; Notable for the following components:      Result Value   MCV 76.0 (*)    MCH 24.8 (*)    All other components within normal limits  URINALYSIS, ROUTINE W REFLEX MICROSCOPIC - Abnormal; Notable for the following components:   Leukocytes,Ua TRACE  (*)    All other components within normal limits  URINALYSIS, MICROSCOPIC (REFLEX) - Abnormal; Notable for the following components:   Bacteria, UA RARE (*)    All other components within normal limits  I-STAT CHEM 8, ED - Abnormal; Notable for the following components:   Creatinine, Ser 1.10 (*)    All other components within normal limits  CBG MONITORING, ED    EKG None  Radiology No results found.  Procedures Procedures    Medications Ordered in ED Medications  dexamethasone (DECADRON) tablet 10 mg (has no administration in time range)  meclizine (ANTIVERT) tablet 25 mg (has no administration in time range)    ED Course/ Medical Decision Making/ A&P                                 Medical Decision Making Amount and/or Complexity of Data Reviewed Labs: ordered.  Risk Prescription drug management.   67 yo F with a chief complaint of episodic dizziness.  Seems to be positional and then resolves with holding still.  No neurologic deficit on exam.  She has a history of vertigo and feels like this is the same.  She also is having a bit of congestion and cough.  No adventitious lung sounds on my exam.  No tachypnea.  Will give a dose of steroid for possible arthritis.  I did offer a headache cocktail which she is declining.  Will have her follow-up with her family doctor in the office.  She had a laboratory evaluation, no anemia, no significant electrolyte abnormalities UA negative for infection.  7:20 PM:  I have discussed the diagnosis/risks/treatment options with the patient.  Evaluation and diagnostic testing in the emergency department does not suggest an emergent condition requiring admission or immediate intervention beyond what has been performed at this time.  They will follow up with PCP. We also discussed returning to the ED immediately if new or worsening sx occur. We discussed the sx which are most concerning (e.g., sudden worsening pain, fever, inability to  tolerate by mouth, stroke s/sx) that necessitate immediate return. Medications administered to the patient during their visit and any new prescriptions provided to the patient are listed below.  Medications given during this visit Medications  dexamethasone (DECADRON) tablet 10 mg (has no administration in time range)  meclizine (ANTIVERT) tablet 25 mg (has no administration in time range)  The patient appears reasonably screen and/or stabilized for discharge and I doubt any other medical condition or other [facility] requiring further screening, evaluation, or treatment in the ED at this time prior to discharge.          Final Clinical Impression(s) / ED Diagnoses Final diagnoses:  Dizziness    Rx / DC Orders ED Discharge Orders          Ordered    meclizine (ANTIVERT) 25 MG tablet  3 times daily PRN        11/17/22 1858              Melene Plan, DO 11/17/22 1920

## 2022-11-17 NOTE — Discharge Instructions (Signed)
Please return for worsening dizziness one-sided numbness or weakness or difficulty with speech or swallowing or inability to walk.  Please follow-up with your family doctor in the office.

## 2022-11-17 NOTE — ED Triage Notes (Signed)
C/O light headed and dizziness started yesterday that comes and go in nature. Describes it as "the room is spinning", endorsed + hx for vertigo.

## 2022-11-29 ENCOUNTER — Ambulatory Visit (HOSPITAL_BASED_OUTPATIENT_CLINIC_OR_DEPARTMENT_OTHER)
Admission: RE | Admit: 2022-11-29 | Discharge: 2022-11-29 | Disposition: A | Discharge status: Home / Self Care | Payer: BLUE CROSS/BLUE SHIELD | Source: Ambulatory Visit | Attending: Adult Health | Admitting: Adult Health

## 2022-11-29 DIAGNOSIS — Z13820 Encounter for screening for osteoporosis: Secondary | ICD-10-CM | POA: Insufficient documentation

## 2022-11-30 ENCOUNTER — Telehealth: Payer: Self-pay | Admitting: *Deleted

## 2022-11-30 NOTE — Telephone Encounter (Signed)
RN placed call to pt regarding above information.  Pt verbalized understanding.

## 2022-11-30 NOTE — Telephone Encounter (Signed)
-----   Message from Noreene Filbert sent at 11/29/2022 12:35 PM EDT ----- Bone density is normal.  Please call patient with the good news ----- Message ----- From: Interface, Rad Results In Sent: 11/29/2022  10:27 AM EDT To: Loa Socks, NP

## 2022-12-03 ENCOUNTER — Other Ambulatory Visit: Payer: Self-pay | Admitting: Internal Medicine

## 2022-12-22 ENCOUNTER — Telehealth: Payer: Self-pay | Admitting: Physician Assistant

## 2022-12-22 MED ORDER — METOPROLOL SUCCINATE ER 50 MG PO TB24: 50.0000 mg | ORAL_TABLET | Freq: Every day | ORAL | 0 refills | Status: DC

## 2022-12-22 NOTE — Telephone Encounter (Signed)
Pt's medication was sent to pt's pharmacy as requested. Confirmation received.  °

## 2022-12-22 NOTE — Telephone Encounter (Signed)
*  STAT* If patient is at the pharmacy, call can be transferred to refill team.   1. Which medications need to be refilled? (please list name of each medication and dose if known)   metoprolol succinate (TOPROL-XL) 50 MG 24 hr tablet    2. Which pharmacy/location (including street and city if local pharmacy) is medication to be sent to?[facility] [address], [place] - [address]   3. Do they need a 30 day or 90 day supply? 90 day

## 2022-12-23 ENCOUNTER — Ambulatory Visit: Payer: BLUE CROSS/BLUE SHIELD | Attending: Physician Assistant | Admitting: Physician Assistant

## 2022-12-23 ENCOUNTER — Other Ambulatory Visit: Payer: Self-pay | Admitting: *Deleted

## 2022-12-23 ENCOUNTER — Encounter: Payer: Self-pay | Admitting: Physician Assistant

## 2022-12-23 VITALS — BP 188/92 | HR 94 | Ht 66.5 in | Wt 172.6 lb

## 2022-12-23 DIAGNOSIS — E785 Hyperlipidemia, unspecified: Secondary | ICD-10-CM

## 2022-12-23 DIAGNOSIS — Z86711 Personal history of pulmonary embolism: Secondary | ICD-10-CM | POA: Diagnosis not present

## 2022-12-23 DIAGNOSIS — I10 Essential (primary) hypertension: Secondary | ICD-10-CM | POA: Diagnosis not present

## 2022-12-23 DIAGNOSIS — R0989 Other specified symptoms and signs involving the circulatory and respiratory systems: Secondary | ICD-10-CM

## 2022-12-23 MED ORDER — AMLODIPINE BESYLATE 5 MG PO TABS: 5.0000 mg | ORAL_TABLET | Freq: Every day | ORAL | 3 refills | Status: DC

## 2022-12-23 MED ORDER — METOPROLOL SUCCINATE ER 50 MG PO TB24: 75.0000 mg | ORAL_TABLET | Freq: Every day | ORAL | 3 refills | Status: DC

## 2022-12-23 NOTE — Patient Instructions (Addendum)
Medication Instructions:  Your physician has recommended you make the following change in your medication:   START Amlodipine 5 mg taking 1 daily  INCREASE the Toprol XL to 50 mg taking 1 1/2 tablet daily  *If you need a refill on your cardiac medications before your next appointment, please call your pharmacy*   Lab Work: 01/05/23 COME TO THE LAB, THEY OPEN 7:30, FASTING:  LIPID  If you have labs (blood work) drawn today and your tests are completely normal, you will receive your results only by: MyChart Message (if you have MyChart) OR A paper copy in the mail If you have any lab test that is abnormal or we need to change your treatment, we will call you to review the results.   Testing/Procedures: Your physician has requested that you have a carotid duplex. This test is an ultrasound of the carotid arteries in your neck. It looks at blood flow through these arteries that supply the brain with blood. Allow one hour for this exam. There are no restrictions or special instructions.    Follow-Up: At [facility], you and your health needs are our priority.  As part of our continuing mission to provide you with exceptional heart care, we have created designated Provider Care Teams.  These Care Teams include your primary Cardiologist (physician) and Advanced Practice Providers (APPs -  Physician Assistants and Nurse Practitioners) who all work together to provide you with the care you need, when you need it.  We recommend signing up for the patient portal called "MyChart".  Sign up information is provided on this After Visit Summary.  MyChart is used to connect with patients for Virtual Visits (Telemedicine).  Patients are able to view lab/test results, encounter notes, upcoming appointments, etc.  Non-urgent messages can be sent to your provider as well.   To learn more about what you can do with MyChart, go to [email or web address].    Your next appointment:   6  month(s)  Provider:   Dr. Rosemary Holms    Other Instructions

## 2022-12-23 NOTE — Progress Notes (Signed)
Today's visit is mostly about Cardiology Office Note:  .   Date:  12/23/2022  ID:  [name], DOB 02-27-1955, MRN [id number] PCP: Leilani Able, MD  [facility] Providers Cardiologist:  Elder Negus, MD {  History of Present Illness: .   [name] is a 67 y.o. female with a past medical history significant for hypertension, hyperlipidemia, and history of pulmonary embolism who is here for follow-up appointment.  Blood pressure was elevated at that time.  She of note had follow-up with her cancer doctor immediately after that appointment.  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  Very controlled blood pressure at 2 separate appointments last month at her home she states is well-controlled as well.  Denied chest pain, shortness of breath, diaphoresis, syncope, edema, orthopnea, PND, and claudication at that appointment on 06/22/2022.   Today, she presents with a history of hypertension, presents with persistently high blood pressure despite being on Lisinopril and Metoprolol. The patient's blood pressure readings have been in the 160s/90s and today it is in the 180s today. The patient has been on the increased dose of Lisinopril for a while, but it does not seem to be significantly impacting her blood pressure. The patient takes Metoprolol in the morning, but her heart rate is still in the 90s. The patient also reports experiencing shortness of breath, particularly when walking or climbing stairs. This symptom has been consistent over time. The patient has a goal to improve her lifestyle, including diet and exercise, to better control her blood pressure and reduce the need for medication. The patient does not consume a lot of salt, but acknowledges that her diet could be improved. The patient also has a history of a blood clot in the lung and is currently on Anastrozole for breast cancer. No longer on blood  thinners.   Reports no chest pain, pressure, or tightness. No edema, orthopnea, PND. Reports no palpitations.   Discussed the use of AI scribe software for clinical note transcription with the patient, who gave verbal consent to proceed.  ROS: Pertinent ROS in HPI  Studies Reviewed: Marland Kitchen        Exercise Sestamibi stress test 01/12/2022: Exercise nuclear stress test was performed using Bruce protocol.   1 Day Rest and Stress images. Exercise time 3 minutes, achieved 4.64 METS, 87% APMHR.  Stress ECG nondiagnostic for ischemia due to baseline ST-T changes. Hypertensive response to exercise (rest 150/86, exercise 260/100 mmHg) Normal myocardial perfusion without convincing evidence of reversible myocardial ischemia or prior infarct. Left ventricular size normal, wall motion preserved, calculated LVEF 71%, visually hyperdynamic. No prior study for comparison. Low risk study based on perfusion images; however, reduced functional capacity for age, hypertensive blood pressure and accelerated heart rate response to exercise.  Clinical correlation warranted.     Echocardiogram 01/12/2022:  Normal LV systolic function with visual EF 60-65%. Left ventricle cavity  is normal in size. Normal left ventricular wall thickness. Normal global  wall motion. Doppler evidence of grade I (impaired) diastolic dysfunction,  normal LAP. Calculated EF 71%.  Structurally normal mitral valve.  Mild (Grade I) mitral regurgitation.  Structurally normal tricuspid valve.  Mild tricuspid regurgitation. No  evidence of pulmonary hypertension.         Physical Exam:   VS:  BP (!) 188/92   Pulse 94   Ht 5' 6.5" (1.689 m)   Wt 172 lb 9.6  oz (78.3 kg)   SpO2 97%   BMI 27.44 kg/m    Wt Readings from Last 3 Encounters:  12/23/22 172 lb 9.6 oz (78.3 kg)  11/17/22 169 lb (76.7 kg)  11/15/22 169 lb 9.6 oz (76.9 kg)    GEN: Well nourished, well developed in no acute distress NECK: No JVD; + LEFT carotid  bruits CARDIAC: RRR, no murmurs, rubs, gallops RESPIRATORY:  Clear to auscultation without rales, wheezing or rhonchi  ABDOMEN: Soft, non-tender, non-distended EXTREMITIES:  No edema; No deformity   ASSESSMENT AND PLAN: .    History of PE -No recurrence, not on AC  Hypertension Uncontrolled on current regimen of Lisinopril and Metoprolol. Discussed adding Amlodipine and increasing Metoprolol dose. Patient's goal is to control blood pressure without medication, discussed lifestyle modifications including diet and exercise. -Add Amlodipine 5mg  daily. -Increase Metoprolol to 75mg  daily (50mg  + 25mg ). -Check blood pressure at home and monitor for changes, please send values after 1-2 weeks  Left Carotid Bruit Detected on physical examination, suggestive of potential stenosis in the left carotid artery. -Order carotid ultrasound to evaluate for stenosis.  Dependent Edema Patient reports swelling after sitting for prolonged periods. -Advise patient to elevate feet during the day or consider compression stockings.  Hyperlipidemia Last lipid panel was several years ago. -Order fasting lipid panel at patient's convenience.  Shortness of Breath on Exertion Stable since last echocardiogram in December of the previous year. No significant findings on previous echocardiogram. -Monitor symptoms, repeat echocardiogram if symptoms worsen.      Dispo: She can follow-up in 6 months  Signed, Sharlene Dory, PA-C

## 2022-12-24 ENCOUNTER — Ambulatory Visit: Payer: Self-pay | Admitting: Cardiology

## 2022-12-31 ENCOUNTER — Encounter (HOSPITAL_COMMUNITY): Payer: BLUE CROSS/BLUE SHIELD

## 2023-01-11 ENCOUNTER — Ambulatory Visit (HOSPITAL_COMMUNITY)
Admission: RE | Admit: 2023-01-11 | Discharge: 2023-01-11 | Disposition: A | Discharge status: Home / Self Care | Payer: BLUE CROSS/BLUE SHIELD | Source: Ambulatory Visit | Attending: Cardiology | Admitting: Cardiology

## 2023-01-11 DIAGNOSIS — I10 Essential (primary) hypertension: Secondary | ICD-10-CM | POA: Insufficient documentation

## 2023-01-11 DIAGNOSIS — Z86711 Personal history of pulmonary embolism: Secondary | ICD-10-CM | POA: Diagnosis present

## 2023-01-11 DIAGNOSIS — R0989 Other specified symptoms and signs involving the circulatory and respiratory systems: Secondary | ICD-10-CM | POA: Insufficient documentation

## 2023-01-11 DIAGNOSIS — E785 Hyperlipidemia, unspecified: Secondary | ICD-10-CM | POA: Insufficient documentation

## 2023-01-14 LAB — LIPID PANEL
Chol/HDL Ratio: 3.1 {ratio} (ref 0.0–4.4)
Cholesterol, Total: 248 mg/dL — ABNORMAL HIGH (ref 100–199)
HDL: 81 mg/dL (ref >39)
LDL Chol Calc (NIH): 149 mg/dL — ABNORMAL HIGH (ref 0–99)
Triglycerides: 105 mg/dL (ref 0–149)
VLDL Cholesterol Cal: 18 mg/dL (ref 5–40)

## 2023-01-17 ENCOUNTER — Other Ambulatory Visit: Payer: Self-pay | Admitting: *Deleted

## 2023-01-17 ENCOUNTER — Telehealth: Payer: Self-pay | Admitting: *Deleted

## 2023-01-17 DIAGNOSIS — E782 Mixed hyperlipidemia: Secondary | ICD-10-CM

## 2023-01-17 NOTE — Telephone Encounter (Signed)
Spoke to pt about holding off on statin med; okay with PA to wait after repeating labs.  Pt would like to try strict diet/exercise and repeat labs in mid March.  Order for lipid panel entered.

## 2023-03-15 ENCOUNTER — Other Ambulatory Visit: Payer: Self-pay | Admitting: Internal Medicine

## 2023-03-22 ENCOUNTER — Other Ambulatory Visit: Payer: Self-pay

## 2023-03-22 NOTE — Telephone Encounter (Signed)
Pt's pharmacy is requesting a refill on lisinopril, pt taking 2 tablets daily. This is not on pt medication list directions. At last office visit pt was started on amlodipine and her metoprolol was increased. Does Dr. Rosemary Holms like to increase pt's lisinopril 20 mg to BID? Please address

## 2023-03-23 MED ORDER — LISINOPRIL 20 MG PO TABS: 40.0000 mg | ORAL_TABLET | Freq: Every evening | ORAL | 3 refills | Status: AC

## 2023-03-23 NOTE — Telephone Encounter (Signed)
This request is being sent to you because pt is stating that she takes lisinopril BID. This is not on the pt's medication list. Would you like to prescribe pt taking lisinopril BID? Please advise

## 2023-04-28 ENCOUNTER — Encounter: Payer: Self-pay | Admitting: Pediatrics

## 2023-05-23 ENCOUNTER — Inpatient Hospital Stay: Payer: BLUE CROSS/BLUE SHIELD | Attending: Hematology and Oncology | Admitting: Hematology and Oncology

## 2023-05-23 ENCOUNTER — Other Ambulatory Visit: Payer: Self-pay

## 2023-05-23 ENCOUNTER — Encounter: Payer: Self-pay | Admitting: Hematology and Oncology

## 2023-05-23 VITALS — BP 156/65 | HR 81 | Temp 97.4°F | Resp 18 | Ht 66.5 in | Wt 162.4 lb

## 2023-05-23 DIAGNOSIS — D0511 Intraductal carcinoma in situ of right breast: Secondary | ICD-10-CM | POA: Insufficient documentation

## 2023-05-23 DIAGNOSIS — F19982 Other psychoactive substance use, unspecified with psychoactive substance-induced sleep disorder: Secondary | ICD-10-CM | POA: Diagnosis not present

## 2023-05-23 DIAGNOSIS — Z923 Personal history of irradiation: Secondary | ICD-10-CM | POA: Diagnosis not present

## 2023-05-23 DIAGNOSIS — Z79811 Long term (current) use of aromatase inhibitors: Secondary | ICD-10-CM | POA: Diagnosis not present

## 2023-05-23 DIAGNOSIS — Z13820 Encounter for screening for osteoporosis: Secondary | ICD-10-CM

## 2023-05-23 DIAGNOSIS — N951 Menopausal and female climacteric states: Secondary | ICD-10-CM | POA: Diagnosis not present

## 2023-05-23 NOTE — Progress Notes (Signed)
 BRIEF ONCOLOGIC HISTORY:  Oncology History  Ductal carcinoma in situ (DCIS) of right breast  04/24/2022 Mammogram   Screening mammogram showed possible focal asymmetry in the right breast which is indeterminate.  Unilateral diagnostic mammogram showed 0.8 cm irregular asymmetry in the right breast.  Ultrasound confirmed the findings, size estimated to be 9 x 1.1 x 1 cm irregular mass   05/07/2022 Pathology Results   Right breast needle core biopsy showed DCIS involving a fibrotic papillary lesion, cribriform, intermediate nuclear grade, no necrosis, no calcs.  Prognostic showed ER 95% positive strong staining PR 90% positive strong staining   05/28/2022 Genetic Testing   Negative [facility] Common Hereditary Cancers +RNA Panel.  Report date is 05/28/2022.    The [facility] Common Hereditary Cancers + RNA Panel includes sequencing, deletion/duplication, and RNA analysis of the following 48 genes: APC, ATM, AXIN2, BAP1, BARD1, BMPR1A, BRCA1, BRCA2, BRIP1, CDH1, CDK4*, CDKN2A*, CHEK2, CTNNA1, DICER1, EPCAM* (del/dup only), FH, GREM1* (promoter dup analysis only), HOXB13*, KIT*, MBD4*, MEN1, MLH1, MSH2, MSH3, MSH6, MUTYH, NF1, NTHL1, PALB2, PDGFRA*, PMS2, POLD1, POLE, PTEN, RAD51C, RAD51D, SDHA (sequencing only), SDHB, SDHC, SDHD, SMAD4, SMARCA4, STK11, TP53, TSC1, TSC2, VHL.  *Genes without RNA analysis.     06/01/2022 Surgery   Right breast lumpectomy: DCIS, 1.4 cm, intermediate grade, margins negative.   06/01/2022 Cancer Staging   Staging form: Breast, AJCC 8th Edition - Pathologic stage from 06/01/2022: Stage 0 (pTis (DCIS), pN0, cM0, ER+, PR+) - Signed by Loa Socks, NP on 11/13/2022 Stage prefix: Initial diagnosis   07/07/2022 - 08/04/2022 Radiation Therapy   Plan Name: Breast_R Site: Breast, Right Technique: 3D Mode: Photon Dose Per Fraction: 2.67 Gy Prescribed Dose (Delivered / Prescribed): 40.05 Gy / 40.05 Gy Prescribed Fxs (Delivered / Prescribed): 15 / 15   Plan Name:  Breast_R_Bst Site: Breast, Right Technique: 3D Mode: Photon Dose Per Fraction: 2 Gy Prescribed Dose (Delivered / Prescribed): 10 Gy / 10 Gy Prescribed Fxs (Delivered / Prescribed): 5 / 5   09/2022 -  Anti-estrogen oral therapy   Anastrozole x 5 years     INTERVAL HISTORY:   Discussed the use of AI scribe software for clinical note transcription with the patient, who gave verbal consent to proceed.  History of Present Illness  [name] is a 68 year old female with breast cancer who presents for follow-up regarding her treatment with anastrozole.  She is currently taking anastrozole daily for her breast cancer treatment. She experiences extreme fatigue and insomnia, which she attributes to the medication, having started approximately six to seven months ago. She has not tried melatonin or other sleep aids, preferring to avoid medications. The insomnia is not due to hot flashes, which she experiences but finds tolerable.  She reports hair thinning, which she associates with the anastrozole. She has not yet tried any supplements for this issue.  She is on multiple medications for hypertension, including amlodipine, metoprolol, and lisinopril. She recently ran out of lisinopril and noticed her blood pressure remained stable, so she has not resumed it. Her blood pressure readings at home are generally within normal range, though it was elevated at today's visit, which she attributes to stress.  She is scheduled for a mammogram later this month and has had a normal bone density scan recently.  REVIEW OF SYSTEMS:  Review of Systems  Constitutional:  Positive for fatigue. Negative for appetite change, chills, fever and unexpected weight change.  HENT:   Negative for hearing loss, lump/mass and trouble swallowing.   Eyes:  Negative for eye problems and icterus.  Respiratory:  Negative for chest tightness, cough and shortness of breath.   Cardiovascular:  Negative for chest pain, leg  swelling and palpitations.  Gastrointestinal:  Negative for abdominal distention, abdominal pain, constipation, diarrhea, nausea and vomiting.  Endocrine: Positive for hot flashes.  Genitourinary:  Negative for difficulty urinating.   Musculoskeletal:  Negative for arthralgias.  Skin:  Negative for itching and rash.  Neurological:  Negative for dizziness, extremity weakness, headaches and numbness.  Hematological:  Negative for adenopathy. Does not bruise/bleed easily.  Psychiatric/Behavioral:  Positive for sleep disturbance. Negative for depression. The patient is not nervous/anxious.    Breast: Denies any new nodularity, masses, tenderness, nipple changes, or nipple discharge.       PAST MEDICAL/SURGICAL HISTORY:  Past Medical History:  Diagnosis Date   Allergy    Anemia    Asthma    Breast cancer (HCC)    Cataract    Chest pain    H/O hysterectomy for benign disease    Heart murmur    Hyperlipidemia    Hypertension    PE (pulmonary embolism)    Pre-diabetes    Pulmonary infarction (HCC)    Thyroid disease    Past Surgical History:  Procedure Laterality Date   ABDOMINAL HYSTERECTOMY     BREAST LUMPECTOMY WITH RADIOACTIVE SEED LOCALIZATION Right 06/01/2022   Procedure: RIGHT BREAST LUMPECTOMY WITH RADIOACTIVE SEED LOCALIZATION;  Surgeon: Abigail Miyamoto, MD;  Location: [facility] OR;  Service: General;  Laterality: Right;     ALLERGIES:  Allergies  Allergen Reactions   Shellfish Allergy Anaphylaxis   Hydrocodone, Benzhydrocodone, And Hydromorphone Other (See Comments)    Sweating per patient     CURRENT MEDICATIONS:  Outpatient Encounter Medications as of 05/23/2023  Medication Sig   amLODipine (NORVASC) 5 MG tablet Take 1 tablet (5 mg total) by mouth daily.   anastrozole (ARIMIDEX) 1 MG tablet Take 1 tablet (1 mg total) by mouth daily.   carboxymethylcellulose (REFRESH PLUS) 0.5 % SOLN 1-2 drops 3 (three) times daily as needed (dry/irritated eyes.).   COLLAGEN PO  Take 3 tablets by mouth daily.   lisinopril (ZESTRIL) 20 MG tablet Take 2 tablets (40 mg total) by mouth at bedtime.   meclizine (ANTIVERT) 25 MG tablet Take 1 tablet (25 mg total) by mouth 3 (three) times daily as needed for dizziness.   metoprolol succinate (TOPROL XL) 50 MG 24 hr tablet Take 1.5 tablets (75 mg total) by mouth daily. Take with or immediately following a meal.   Multiple Vitamin (MULTIVITAMIN WITH MINERALS) TABS tablet Take 1 tablet by mouth in the morning.   No facility-administered encounter medications on file as of 05/23/2023.     ONCOLOGIC FAMILY HISTORY:  Family History  Problem Relation Age of Onset   Heart failure Mother    Diabetes Mother    Kidney disease Mother    Breast cancer Sister 12   Prostate cancer Maternal Uncle    Cancer Maternal Grandmother        unknown primary; mets; d. early 45s   Diabetes Other    Hypertension Other    Cancer Other    Asthma Other    Breast cancer Cousin        dx 56s-60s; paternal female cousin   Colon cancer Neg Hx    Esophageal cancer Neg Hx    Rectal cancer Neg Hx    Stomach cancer Neg Hx    Pancreatic cancer Neg Hx  SOCIAL HISTORY:  Social History   Socioeconomic History   Marital status: Divorced    Spouse name: Not on file   Number of children: Not on file   Years of education: Not on file   Highest education level: Not on file  Occupational History   Not on file  Tobacco Use   Smoking status: Never   Smokeless tobacco: Never  Vaping Use   Vaping status: Never Used  Substance and Sexual Activity   Alcohol use: No   Drug use: No   Sexual activity: Not on file  Other Topics Concern   Not on file  Social History Narrative   Not on file   Social Drivers of Health   Financial Resource Strain: Low Risk  (05/19/2022)   Overall Financial Resource Strain (CARDIA)    Difficulty of Paying Living Expenses: Not very hard  Food Insecurity: No Food Insecurity (05/19/2022)   Hunger Vital Sign     Worried About Running Out of Food in the Last Year: Never true    Ran Out of Food in the Last Year: Never true  Transportation Needs: No Transportation Needs (05/19/2022)   PRAPARE - Administrator, Civil Service (Medical): No    Lack of Transportation (Non-Medical): No  Physical Activity: Not on file  Stress: Not on file  Social Connections: Not on file  Intimate Partner Violence: Not on file     OBSERVATIONS/OBJECTIVE:  BP (!) 156/65 (BP Location: Left Arm, Patient Position: Sitting)   Pulse 81   Temp (!) 97.4 F (36.3 C) (Tympanic)   Resp 18   Ht 5' 6.5" (1.689 m)   Wt 162 lb 6.4 oz (73.7 kg)   SpO2 100%   BMI 25.82 kg/m  GENERAL: Patient is a well appearing female in no acute distress HEENT:  Sclerae anicteric.  Oropharynx clear and moist. No ulcerations or evidence of oropharyngeal candidiasis. Neck is supple.  NODES:  No cervical, supraclavicular, or axillary lymphadenopathy palpated.  BREAST EXAM: Right breast status postlumpectomy and radiation no sign of local recurrence left breast is benign. LUNGS:  Clear to auscultation bilaterally.  No wheezes or rhonchi. HEART:  Regular rate and rhythm. No murmur appreciated. ABDOMEN:  Soft, nontender.  Positive, normoactive bowel sounds. No organomegaly palpated. MSK:  No focal spinal tenderness to palpation. Full range of motion bilaterally in the upper extremities. EXTREMITIES:  No peripheral edema.   SKIN:  Clear with no obvious rashes or skin changes. No nail dyscrasia. NEURO:  Nonfocal. Well oriented.  Appropriate affect.   LABORATORY DATA:  None for this visit.  DIAGNOSTIC IMAGING:  None for this visit.    ASSESSMENT AND PLAN:  [name] is a pleasant 68 y.o. female with Stage 0 right breast DCIS, ER+/PR+, diagnosed in 05/2022, treated with lumpectomy, adjuvant radiation therapy, and anti-estrogen therapy with anastrozole beginning in 09/2022. Assessment & Plan  Breast Cancer On anastrozole with side  effects: insomnia, fatigue, hot flashes, hair thinning. Insomnia and fatigue most bothersome. Bone density normal, near osteopenia. - Try OTC sleep aids for insomnia: melatonin, warm milk, warm showers, magnesium. - Consider biotin or Nutrafol for hair thinning. - Schedule mammogram April 23 or 24, 2025. - Repeat bone density test October 2026. - Follow up with breast surgeon in fall.  Hypertension Blood pressure well-controlled on amlodipine, metoprolol, lisinopril. Lisinopril not taken recently, blood pressure stable at home. - Monitor blood pressure at home. - Resume lisinopril if blood pressure increases.  Normal bone density, recommend  continuuing weight bearing exercises, vit D supplementation and calcium  Time spent: 30 min  *Total Encounter Time as defined by the Centers for Medicare and Medicaid Services includes, in addition to the face-to-face time of a patient visit (documented in the note above) non-face-to-face time: obtaining and reviewing outside history, ordering and reviewing medications, tests or procedures, care coordination (communications with other health care professionals or caregivers) and documentation in the medical record.

## 2023-06-02 ENCOUNTER — Telehealth: Payer: Self-pay | Admitting: *Deleted

## 2023-06-02 NOTE — Telephone Encounter (Signed)
 Received [employer] FMLA signed by provider today.  Successfully returned to [facility] Coordinator [phone])..  Emailed to patient [email or web address].  Copy to HIM bin for items to be scanned.  Called SHARDA KEDDY, [phone] (home) to notify of above.  Approved mailing form to address on file. [address] [place] [address] Process completed with no further instructions received or actions performed by this nurse.

## 2023-06-10 ENCOUNTER — Encounter: Payer: Self-pay | Admitting: Hematology and Oncology

## 2023-06-27 NOTE — Progress Notes (Signed)
 [facility] Initial Consultation   Referring Provider Danella Dunn, MD [address] [place],  [place] [address]  Primary Care Provider Danella Dunn, MD  Patient Profile: [name] is a 68 y.o. female who is seen in consultation in the [facility] at the request of Dr. Alayne Hubert for evaluation and management of the problem(s) noted below.  Problem List: GERD Colorectal cancer screening   History of Present Illness   [name] is a 68 y.o. female with a history of HLD, HTN, pulmonary embolism, breast cancer, hypothyroidism, HLD who presents to the office for evaluation of GERD and colorectal cancer screening  GERD -- History of intermittent GERD symptoms for many years -- Managed with OTC medications and Tums -- States that she has taken Tums twice in the last year -- Feels that her symptoms are primarily stress related and not necessarily due to diet -- No dysphagia, odynophagia, nausea, vomiting, weight loss -- No history of anemia-states that she had recent lab work with PCP in March/April 2025-will obtain labs results; no anemia on labs December 2024 -- No prior history of H. pylori infection -- No family history of Barrett's esophagus or esophageal cancer -- Minimal tenderness to palpation in the epigastrium on physical exam today  Colorectal cancer screening -- No previous colonoscopy -- States she has had Cologuard testing in the past that has been negative -- No family history of colorectal cancer or polyps -- Has 1 formed bowel movement a day without any blood or mucus -- Occasionally notes an increase in bowel frequency when experiencing stress -- No abdominal pain or cramping -- As above, will obtain outside labs from PCP to ensure no evidence of anemia; no anemia on CBC in December 2024  -- Reports that when she underwent hysterectomy in the past she was sleepy for days and had difficulty waking up -- States that her response to  general anesthesia resulted in prolonged hospitalization and subsequent PE -- No longer on anticoagulation  Last colonoscopy: None Last endoscopy: None  Last Abd CT/CTE/MRE: None  GI Review of Symptoms Significant for rare GERD symptoms. Otherwise negative.  General Review of Systems  Review of systems is significant for the pertinent positives and negatives as listed per the HPI.  Full ROS is otherwise negative.  Past Medical History   Past Medical History:  Diagnosis Date   Allergy    Anemia    Asthma    Breast cancer (HCC)    Cataract    Chest pain    GERD (gastroesophageal reflux disease)    H/O hysterectomy for benign disease    Heart murmur    Hyperlipidemia    Hypertension    PE (pulmonary embolism)    Pre-diabetes    Pulmonary infarction (HCC)    Thyroid  disease      Past Surgical History   Past Surgical History:  Procedure Laterality Date   ABDOMINAL HYSTERECTOMY     BREAST LUMPECTOMY WITH RADIOACTIVE SEED LOCALIZATION Right 06/01/2022   Procedure: RIGHT BREAST LUMPECTOMY WITH RADIOACTIVE SEED LOCALIZATION;  Surgeon: Oza Blumenthal, MD;  Location: [facility] OR;  Service: General;  Laterality: Right;     Allergies and Medications   Allergies  Allergen Reactions   Shellfish Allergy Anaphylaxis   Hydrocodone , Benzhydrocodone, And Hydromorphone Other (See Comments)    Sweating per patient    Current Meds  Medication Sig   anastrozole  (ARIMIDEX ) 1 MG tablet Take 1 tablet (1 mg total) by mouth daily.   carboxymethylcellulose (REFRESH PLUS)  0.5 % SOLN 1-2 drops 3 (three) times daily as needed (dry/irritated eyes.).   COLLAGEN PO Take 3 tablets by mouth daily.   lisinopril  (ZESTRIL ) 20 MG tablet Take 2 tablets (40 mg total) by mouth at bedtime.   meclizine  (ANTIVERT ) 25 MG tablet Take 1 tablet (25 mg total) by mouth 3 (three) times daily as needed for dizziness.   metoprolol  succinate (TOPROL  XL) 50 MG 24 hr tablet Take 1.5 tablets (75 mg total) by mouth  daily. Take with or immediately following a meal.   Multiple Vitamin (MULTIVITAMIN WITH MINERALS) TABS tablet Take 1 tablet by mouth in the morning.   Na Sulfate-K Sulfate-Mg Sulfate concentrate (SUPREP) 17.5-3.13-1.6 GM/177ML SOLN Take 1 kit (354 mLs total) by mouth once for 1 dose.     Family History   Family History  Problem Relation Age of Onset   Heart failure Mother    Diabetes Mother    Kidney disease Mother    Breast cancer Sister 45   Prostate cancer Maternal Uncle    Cancer Maternal Grandmother        unknown primary; mets; d. early 76s   Diabetes Other    Hypertension Other    Cancer Other    Asthma Other    Breast cancer Cousin        dx 22s-60s; paternal female cousin   Colon cancer Neg Hx    Esophageal cancer Neg Hx    Rectal cancer Neg Hx    Stomach cancer Neg Hx    Pancreatic cancer Neg Hx      Social History   Social History   Tobacco Use   Smoking status: Never   Smokeless tobacco: Never  Vaping Use   Vaping status: Never Used  Substance Use Topics   Alcohol use: No   Drug use: No   Kammy reports that she has never smoked. She has never used smokeless tobacco. She reports that she does not drink alcohol and does not use drugs.  Vital Signs and Physical Examination   Vitals:   06/29/23 0829  BP: 128/76  Pulse: 82   Body mass index is 25.98 kg/m. Weight: 163 lb 6.4 oz (74.1 kg)  General: Well developed, well nourished, no acute distress Head: Normocephalic and atraumatic Eyes: Sclerae anicteric, EOMI Lungs: Clear throughout to auscultation Heart: Regular rate and rhythm; No murmurs, rubs or bruits Abdomen: Soft, mild tenderness to palpation in the midepigastrium and non distended. No masses, hepatosplenomegaly or hernias noted. Normal Bowel sounds Rectal: Deferred Musculoskeletal: Symmetrical with no gross deformities    Review of Data  The following data was reviewed at the time of this encounter:  Laboratory Studies       Latest Ref Rng & Units 11/17/2022    6:00 PM 11/17/2022    5:41 PM 05/19/2022    8:18 AM  CBC  WBC 4.0 - 10.5 K/uL  5.3  5.5   Hemoglobin 12.0 - 15.0 g/dL 40.9  81.1  91.4   Hematocrit 36.0 - 46.0 % 38.0  37.1  37.7   Platelets 150 - 400 K/uL  303  321     No results found for: "LIPASE"    Latest Ref Rng & Units 11/17/2022    6:00 PM 05/19/2022    8:18 AM 11/16/2021    2:33 PM  CMP  Glucose 70 - 99 mg/dL 93  87  782   BUN 8 - 23 mg/dL 11  11  12    Creatinine 0.44 - 1.00  mg/dL 9.14  7.82  9.56   Sodium 135 - 145 mmol/L 142  142  143   Potassium 3.5 - 5.1 mmol/L 4.0  4.7  4.7   Chloride 98 - 111 mmol/L 105  107  106   CO2 22 - 32 mmol/L  30  30   Calcium 8.9 - 10.3 mg/dL  21.3  9.8   Total Protein 6.5 - 8.1 g/dL  7.3    Total Bilirubin 0.3 - 1.2 mg/dL  0.5    Alkaline Phos 38 - 126 U/L  58    AST 15 - 41 U/L  14    ALT 0 - 44 U/L  11     Patient reports a negative Cologuard in the past  Imaging Studies  None  GI Procedures and Studies  None  Clinical Impression  It is my clinical impression that Ms. Heick is a 68 y.o. female with;  GERD Colorectal cancer screening  Ms. Dumler presents to the office today to review her history of GERD and discuss colorectal cancer screening.  She has had a longstanding history of GERD although it seems to be fairly mild.  Her GERD has been managed with OTC medications-most recently Tums.  She states that she has only needed to take Tums twice in the last year.  There are no alarm features related to her GERD -no dysphagia, odynophagia, nausea, vomiting or weight loss.  We discussed that in the absence of alarm features or refractory GERD there would not necessarily be a reason to perform an upper endoscopy at this juncture.  I will, however, request updated laboratory work that was performed at her PCP office in March/April 2025 to confirm no evidence of anemia that would warrant performing an upper endoscopy.  She would like to proceed with  colonoscopy for colorectal cancer screening.  She has had a negative Cologuard in the past.  Does not endorse any alarm features-no melena, hematochezia, abdominal pain.  No family history of colon cancer or polyps.  She informs me that at the time of her hysterectomy in the past she had difficulty waking from anesthesia which resulted in a prolonged hospitalization.  Reviewed that she would have received general anesthesia for that surgery and will be administered MAC for colonoscopy.  Advised her to review this aspect of her history with anesthesia during preanesthesia assessment.  Plan  Schedule colonoscopy with 1 day bowel prep at [facility] We will request recent labs from March/April 2025 from PCP -if there is evidence of anemia on labs will consider adding EGD to colonoscopy. May continue using Tums as needed for management of GERD -if symptoms worsen or progress can consider trial of H2 blocker or PPI.  Planned Follow Up PRN  The patient or caregiver verbalized understanding of the material covered, with no barriers to understanding. All questions were answered. Patient or caregiver is agreeable with the plan outlined above.    It was a pleasure to see Khalila.  If you have any questions or concerns regarding this evaluation, do not hesitate to contact me.  Eugenia Hess, MD [facility]

## 2023-06-29 ENCOUNTER — Ambulatory Visit: Admitting: Pediatrics

## 2023-06-29 ENCOUNTER — Encounter: Payer: Self-pay | Admitting: Pediatrics

## 2023-06-29 VITALS — BP 128/76 | HR 82 | Ht 66.5 in | Wt 163.4 lb

## 2023-06-29 DIAGNOSIS — K219 Gastro-esophageal reflux disease without esophagitis: Secondary | ICD-10-CM

## 2023-06-29 DIAGNOSIS — Z1211 Encounter for screening for malignant neoplasm of colon: Secondary | ICD-10-CM

## 2023-06-29 MED ORDER — NA SULFATE-K SULFATE-MG SULF 17.5-3.13-1.6 GM/177ML PO SOLN: 1.0000 | Freq: Once | ORAL | 0 refills | Status: AC

## 2023-06-29 NOTE — Patient Instructions (Signed)
 You have been scheduled for a colonoscopy. Please follow written instructions given to you at your visit today.   If you use inhalers (even only as needed), please bring them with you on the day of your procedure.  DO NOT TAKE 7 DAYS PRIOR TO TEST- Trulicity (dulaglutide) Ozempic, Wegovy (semaglutide) Mounjaro (tirzepatide) Bydureon Bcise (exanatide extended release)  DO NOT TAKE 1 DAY PRIOR TO YOUR TEST Rybelsus (semaglutide) Adlyxin (lixisenatide) Victoza (liraglutide) Byetta (exanatide) ___________________________________________________________________________   Thank you for entrusting me with your care and for choosing [facility],  Dr. Eugenia Hess  _______________________________________________________  If your blood pressure at your visit was 140/90 or greater, please contact your primary care physician to follow up on this.  _______________________________________________________  If you are age 107 or older, your body mass index should be between 23-30. Your Body mass index is 25.98 kg/m. If this is out of the aforementioned range listed, please consider follow up with your Primary Care Provider.  If you are age 79 or younger, your body mass index should be between 19-25. Your Body mass index is 25.98 kg/m. If this is out of the aformentioned range listed, please consider follow up with your Primary Care Provider.   ________________________________________________________  The [facility] providers would like to encourage you to use MYCHART to communicate with providers for non-urgent requests or questions.  Due to long hold times on the telephone, sending your provider a message by [facility] may be a faster and more efficient way to get a response.  Please allow 48 business hours for a response.  Please remember that this is for non-urgent requests.  _______________________________________________________

## 2023-07-28 ENCOUNTER — Encounter: Payer: Self-pay | Admitting: Pediatrics

## 2023-08-02 NOTE — Progress Notes (Unsigned)
 [facility] History and Physical   Primary Care Physician:  Ilah Crigler, MD   Reason for Procedure:  Colorectal cancer screening  Plan:    Screening colonoscopy     HPI: [name] is a 68 y.o. female undergoing screening colonoscopy for colorectal cancer screening.  This is the patient's first colonoscopy.  No family history of colorectal cancer or polyps.  Patient denies current symptoms of rectal bleeding or change in bowel habits at the time of this exam.   Past Medical History:  Diagnosis Date   Allergy    Anemia    Asthma    Breast cancer (HCC)    Cataract    Chest pain    GERD (gastroesophageal reflux disease)    H/O hysterectomy for benign disease    Heart murmur    Hyperlipidemia    Hypertension    PE (pulmonary embolism)    Pre-diabetes    Pulmonary infarction (HCC)    Thyroid  disease     Past Surgical History:  Procedure Laterality Date   ABDOMINAL HYSTERECTOMY     BREAST LUMPECTOMY WITH RADIOACTIVE SEED LOCALIZATION Right 06/01/2022   Procedure: RIGHT BREAST LUMPECTOMY WITH RADIOACTIVE SEED LOCALIZATION;  Surgeon: Vernetta Berg, MD;  Location: [facility] OR;  Service: General;  Laterality: Right;    Prior to Admission medications   Medication Sig Start Date End Date Taking? Authorizing Provider  amLODipine  (NORVASC ) 5 MG tablet Take 1 tablet (5 mg total) by mouth daily. 12/23/22 03/23/23  Lucien Orren SAILOR, PA-C  anastrozole  (ARIMIDEX ) 1 MG tablet Take 1 tablet (1 mg total) by mouth daily. 09/17/22   Crawford Morna Pickle, NP  carboxymethylcellulose (REFRESH PLUS) 0.5 % SOLN 1-2 drops 3 (three) times daily as needed (dry/irritated eyes.).    [provider]  COLLAGEN PO Take 3 tablets by mouth daily.    [provider]  lisinopril  (ZESTRIL ) 20 MG tablet Take 2 tablets (40 mg total) by mouth at bedtime. 03/23/23   Lucien Orren SAILOR, PA-C  meclizine  (ANTIVERT ) 25 MG tablet Take 1 tablet (25 mg total) by mouth 3 (three) times daily as  needed for dizziness. 11/17/22   Emil Share, DO  metoprolol  succinate (TOPROL  XL) 50 MG 24 hr tablet Take 1.5 tablets (75 mg total) by mouth daily. Take with or immediately following a meal. 12/23/22   Lucien Orren SAILOR, PA-C  Multiple Vitamin (MULTIVITAMIN WITH MINERALS) TABS tablet Take 1 tablet by mouth in the morning.    [provider]    Current Outpatient Medications  Medication Sig Dispense Refill   amLODipine  (NORVASC ) 5 MG tablet Take 1 tablet (5 mg total) by mouth daily. 90 tablet 3   anastrozole  (ARIMIDEX ) 1 MG tablet Take 1 tablet (1 mg total) by mouth daily. 90 tablet 3   carboxymethylcellulose (REFRESH PLUS) 0.5 % SOLN 1-2 drops 3 (three) times daily as needed (dry/irritated eyes.).     lisinopril  (ZESTRIL ) 20 MG tablet Take 2 tablets (40 mg total) by mouth at bedtime. 180 tablet 3   metoprolol  succinate (TOPROL  XL) 50 MG 24 hr tablet Take 1.5 tablets (75 mg total) by mouth daily. Take with or immediately following a meal. 135 tablet 3   Multiple Vitamin (MULTIVITAMIN WITH MINERALS) TABS tablet Take 1 tablet by mouth in the morning.     COLLAGEN PO Take 3 tablets by mouth daily.     Current Facility-Administered Medications  Medication Dose Route Frequency Provider Last Rate Last Admin   0.9 %  sodium chloride  infusion  500 mL Intravenous Continuous Mahogani Holohan, Inocente HERO, MD        Allergies as of 08/04/2023 - Review Complete 08/04/2023  Allergen Reaction Noted   Shellfish allergy Anaphylaxis 05/09/2011   Hydrocodone , benzhydrocodone, and hydromorphone Other (See Comments) 06/01/2022    Family History  Problem Relation Age of Onset   Heart failure Mother    Diabetes Mother    Kidney disease Mother    Breast cancer Sister 82   Prostate cancer Maternal Uncle    Cancer Maternal Grandmother        unknown primary; mets; d. early 2s   Diabetes Other    Hypertension Other    Cancer Other    Asthma Other    Breast cancer Cousin        dx 63s-60s; paternal female  cousin   Colon cancer Neg Hx    Esophageal cancer Neg Hx    Rectal cancer Neg Hx    Stomach cancer Neg Hx    Pancreatic cancer Neg Hx     Social History   Socioeconomic History   Marital status: Divorced    Spouse name: Not on file   Number of children: Not on file   Years of education: Not on file   Highest education level: Not on file  Occupational History   Not on file  Tobacco Use   Smoking status: Never   Smokeless tobacco: Never  Vaping Use   Vaping status: Never Used  Substance and Sexual Activity   Alcohol use: No   Drug use: No   Sexual activity: Not on file  Other Topics Concern   Not on file  Social History Narrative   Not on file   Social Drivers of Health   Financial Resource Strain: Low Risk  (05/19/2022)   Overall Financial Resource Strain (CARDIA)    Difficulty of Paying Living Expenses: Not very hard  Food Insecurity: No Food Insecurity (05/19/2022)   Hunger Vital Sign    Worried About Running Out of Food in the Last Year: Never true    Ran Out of Food in the Last Year: Never true  Transportation Needs: No Transportation Needs (05/19/2022)   PRAPARE - Administrator, Civil Service (Medical): No    Lack of Transportation (Non-Medical): No  Physical Activity: Not on file  Stress: Not on file  Social Connections: Not on file  Intimate Partner Violence: Not on file    Review of Systems:  All other review of systems negative except as mentioned in the HPI.  Physical Exam: Vital signs BP 127/80   Pulse 67   Temp 98.1 F (36.7 C)   Resp 15   Ht 5' 6 (1.676 m)   Wt 163 lb (73.9 kg)   SpO2 100%   BMI 26.31 kg/m   General:   Alert,  Well-developed, well-nourished, pleasant and cooperative in NAD Airway:  Mallampati 2 Lungs:  Clear throughout to auscultation.   Heart:  Regular rate and rhythm; no murmurs, clicks, rubs,  or gallops. Abdomen:  Soft, nontender and nondistended. Normal bowel sounds.   Neuro/Psych:  Normal mood and  affect. A and O x 3  Inocente Hausen, MD [facility]

## 2023-08-04 ENCOUNTER — Encounter: Payer: Self-pay | Admitting: Pediatrics

## 2023-08-04 ENCOUNTER — Ambulatory Visit: Admitting: Pediatrics

## 2023-08-04 VITALS — BP 142/83 | HR 90 | Temp 98.1°F | Resp 20 | Ht 66.0 in | Wt 163.0 lb

## 2023-08-04 DIAGNOSIS — Z1211 Encounter for screening for malignant neoplasm of colon: Secondary | ICD-10-CM

## 2023-08-04 DIAGNOSIS — D122 Benign neoplasm of ascending colon: Secondary | ICD-10-CM | POA: Diagnosis not present

## 2023-08-04 DIAGNOSIS — K648 Other hemorrhoids: Secondary | ICD-10-CM | POA: Diagnosis not present

## 2023-08-04 MED ORDER — SODIUM CHLORIDE 0.9 % IV SOLN: 500.0000 mL | INTRAVENOUS | Status: DC

## 2023-08-04 NOTE — Progress Notes (Signed)
 Called to room to assist during endoscopic procedure.  Patient ID and intended procedure confirmed with present staff. Received instructions for my participation in the procedure from the performing physician.

## 2023-08-04 NOTE — Progress Notes (Signed)
 Report given to PACU, vss

## 2023-08-04 NOTE — Op Note (Signed)
 [facility] Patient Name: [name] Procedure Date: 08/04/2023 2:35 PM MRN: [id number] Endoscopist: Inocente Hausen , MD, [id number] Age: 68 Referring MD:  Date of Birth: 1955/11/04 Gender: Female Account #: [id number] Procedure:                Colonoscopy Indications:              Screening for colorectal malignant neoplasm, This                            is the patient's first colonoscopy Medicines:                Monitored Anesthesia Care Procedure:                Pre-Anesthesia Assessment:                           - Prior to the procedure, a History and Physical                            was performed, and patient medications and                            allergies were reviewed. The patient's tolerance of                            previous anesthesia was also reviewed. The risks                            and benefits of the procedure and the sedation                            options and risks were discussed with the patient.                            All questions were answered, and informed consent                            was obtained. Prior Anticoagulants: The patient has                            taken no anticoagulant or antiplatelet agents. ASA                            Grade Assessment: II - A patient with mild systemic                            disease. After reviewing the risks and benefits,                            the patient was deemed in satisfactory condition to                            undergo the procedure.  After obtaining informed consent, the colonoscope                            was passed under direct vision. Throughout the                            procedure, the patient's blood pressure, pulse, and                            oxygen saturations were monitored continuously. The                            CF [id number] #[id number] was introduced through the anus                            and advanced to the  cecum, identified by its                            appearance. The colonoscopy was performed without                            difficulty. The patient tolerated the procedure                            well. The quality of the bowel preparation was                            good. The ileocecal valve, appendiceal orifice, and                            rectum were photographed. Scope In: 3:01:25 PM Scope Out: 3:16:32 PM Scope Withdrawal Time: 0 hours 12 minutes 18 seconds  Total Procedure Duration: 0 hours 15 minutes 7 seconds  Findings:                 The perianal and digital rectal examinations were                            normal. Pertinent negatives include normal                            sphincter tone and no palpable rectal lesions.                           A 4 mm polyp was found in the ascending colon. The                            polyp was sessile. The polyp was removed with a                            cold biopsy forceps. Resection and retrieval were                            complete.  Internal hemorrhoids were found during retroflexion. Complications:            No immediate complications. Estimated blood loss:                            Minimal. Estimated Blood Loss:     Estimated blood loss was minimal. Impression:               - One 4 mm polyp in the ascending colon, removed                            with a cold biopsy forceps. Resected and retrieved.                           - Internal hemorrhoids. Recommendation:           - Discharge patient to home (ambulatory).                           - Await pathology results.                           - Repeat colonoscopy for surveillance based on                            pathology results.                           - The findings and recommendations were discussed                            with the patient's family.                           - Return to referring physician.                            - Patient has a contact number available for                            emergencies. The signs and symptoms of potential                            delayed complications were discussed with the                            patient. Return to normal activities tomorrow.                            Written discharge instructions were provided to the                            patient. Inocente Hausen, MD 08/04/2023 3:20:00 PM This report has been signed electronically.

## 2023-08-04 NOTE — Progress Notes (Signed)
 Pt's states no medical or surgical changes since previsit or office visit.

## 2023-08-04 NOTE — Patient Instructions (Signed)
Handout on polyps provided.  Await pathology results.  YOU HAD AN ENDOSCOPIC PROCEDURE TODAY AT [facility]:   Refer to the procedure report that was given to you for any specific questions about what was found during the examination.  If the procedure report does not answer your questions, please call your gastroenterologist to clarify.  If you requested that your care partner not be given the details of your procedure findings, then the procedure report has been included in a sealed envelope for you to review at your convenience later.  YOU SHOULD EXPECT: Some feelings of bloating in the abdomen. Passage of more gas than usual.  Walking can help get rid of the air that was put into your GI tract during the procedure and reduce the bloating. If you had a lower endoscopy (such as a colonoscopy or flexible sigmoidoscopy) you may notice spotting of blood in your stool or on the toilet paper. If you underwent a bowel prep for your procedure, you may not have a normal bowel movement for a few days.  Please Note:  You might notice some irritation and congestion in your nose or some drainage.  This is from the oxygen used during your procedure.  There is no need for concern and it should clear up in a day or so.  SYMPTOMS TO REPORT IMMEDIATELY:  Following lower endoscopy (colonoscopy or flexible sigmoidoscopy):  Excessive amounts of blood in the stool  Significant tenderness or worsening of abdominal pains  Swelling of the abdomen that is new, acute  Fever of 100F or higher   For urgent or emergent issues, a gastroenterologist can be reached at any hour by calling [phone]. Do not use MyChart messaging for urgent concerns.    DIET:  We do recommend a small meal at first, but then you may proceed to your regular diet.  Drink plenty of fluids but you should avoid alcoholic beverages for 24 hours.  ACTIVITY:  You should plan to take it easy for the rest of today and you should  NOT DRIVE or use heavy machinery until tomorrow (because of the sedation medicines used during the test).    FOLLOW UP: Our staff will call the number listed on your records the next business day following your procedure.  We will call around 7:15- 8:00 am to check on you and address any questions or concerns that you may have regarding the information given to you following your procedure. If we do not reach you, we will leave a message.     If any biopsies were taken you will be contacted by phone or by letter within the next 1-3 weeks.  Please call us at [phone] if you have not heard about the biopsies in 3 weeks.    SIGNATURES/CONFIDENTIALITY: You and/or your care partner have signed paperwork which will be entered into your electronic medical record.  These signatures attest to the fact that that the information above on your After Visit Summary has been reviewed and is understood.  Full responsibility of the confidentiality of this discharge information lies with you and/or your care-partner.

## 2023-08-05 ENCOUNTER — Telehealth: Payer: Self-pay | Admitting: *Deleted

## 2023-08-05 NOTE — Telephone Encounter (Signed)
  Follow up Call-     08/04/2023    1:54 PM  Call back number  Post procedure Call Back phone  # [phone]  Permission to leave phone message Yes     Patient questions:  Do you have a fever, pain , or abdominal swelling? No. Pain Score  0 *  Have you tolerated food without any problems? Yes.    Have you been able to return to your normal activities? Yes.    Do you have any questions about your discharge instructions: Diet   No. Medications  No. Follow up visit  No.  Do you have questions or concerns about your Care? No.  Actions: * If pain score is 4 or above: No action needed, pain <4.

## 2023-08-09 ENCOUNTER — Ambulatory Visit: Payer: Self-pay | Admitting: Pediatrics

## 2023-08-09 LAB — SURGICAL PATHOLOGY

## 2023-08-13 ENCOUNTER — Ambulatory Visit: Payer: Self-pay | Admitting: Cardiology

## 2023-09-04 ENCOUNTER — Other Ambulatory Visit: Payer: Self-pay | Admitting: Adult Health

## 2023-09-04 DIAGNOSIS — D0511 Intraductal carcinoma in situ of right breast: Secondary | ICD-10-CM

## 2023-11-12 ENCOUNTER — Emergency Department (HOSPITAL_BASED_OUTPATIENT_CLINIC_OR_DEPARTMENT_OTHER)
Admission: EM | Admit: 2023-11-12 | Discharge: 2023-11-12 | Disposition: A | Discharge status: Home / Self Care | Attending: Emergency Medicine | Admitting: Emergency Medicine

## 2023-11-12 ENCOUNTER — Other Ambulatory Visit: Payer: Self-pay

## 2023-11-12 ENCOUNTER — Encounter (HOSPITAL_BASED_OUTPATIENT_CLINIC_OR_DEPARTMENT_OTHER): Payer: Self-pay | Admitting: Emergency Medicine

## 2023-11-12 DIAGNOSIS — R42 Dizziness and giddiness: Secondary | ICD-10-CM | POA: Diagnosis present

## 2023-11-12 DIAGNOSIS — H6123 Impacted cerumen, bilateral: Secondary | ICD-10-CM | POA: Diagnosis not present

## 2023-11-12 LAB — COMPREHENSIVE METABOLIC PANEL WITH GFR
ALT: 11 U/L (ref 0–44)
AST: 18 U/L (ref 15–41)
Albumin: 4.2 g/dL (ref 3.5–5.0)
Alkaline Phosphatase: 69 U/L (ref 38–126)
Anion gap: 10 (ref 5–15)
BUN: 11 mg/dL (ref 8–23)
CO2: 28 mmol/L (ref 22–32)
Calcium: 9.6 mg/dL (ref 8.9–10.3)
Chloride: 106 mmol/L (ref 98–111)
Creatinine, Ser: 0.98 mg/dL (ref 0.44–1.00)
GFR, Estimated: >60 mL/min (ref >60)
Glucose, Bld: 116 mg/dL — ABNORMAL HIGH (ref 70–99)
Potassium: 3.9 mmol/L (ref 3.5–5.1)
Sodium: 144 mmol/L (ref 135–145)
Total Bilirubin: 0.3 mg/dL (ref 0.0–1.2)
Total Protein: 7.2 g/dL (ref 6.5–8.1)

## 2023-11-12 LAB — CBC WITH DIFFERENTIAL/PLATELET
Abs Immature Granulocytes: 0.01 K/uL (ref 0.00–0.07)
Basophils Absolute: 0 K/uL (ref 0.0–0.1)
Basophils Relative: 1 %
Eosinophils Absolute: 0.1 K/uL (ref 0.0–0.5)
Eosinophils Relative: 3 %
HCT: 37.2 % (ref 36.0–46.0)
Hemoglobin: 12.1 g/dL (ref 12.0–15.0)
Immature Granulocytes: 0 %
Lymphocytes Relative: 56 %
Lymphs Abs: 2.7 K/uL (ref 0.7–4.0)
MCH: 24.6 pg — ABNORMAL LOW (ref 26.0–34.0)
MCHC: 32.5 g/dL (ref 30.0–36.0)
MCV: 75.8 fL — ABNORMAL LOW (ref 80.0–100.0)
Monocytes Absolute: 0.5 K/uL (ref 0.1–1.0)
Monocytes Relative: 10 %
Neutro Abs: 1.5 K/uL — ABNORMAL LOW (ref 1.7–7.7)
Neutrophils Relative %: 30 %
Platelets: 324 K/uL (ref 150–400)
RBC: 4.91 MIL/uL (ref 3.87–5.11)
RDW: 14.4 % (ref 11.5–15.5)
WBC: 4.9 K/uL (ref 4.0–10.5)
nRBC: 0 % (ref 0.0–0.2)

## 2023-11-12 MED ORDER — DOCUSATE SODIUM 50 MG/5ML PO LIQD
10.0000 mg | Freq: Once | ORAL | Status: AC
  Administered 2023-11-12: 10 mg via OTIC
  Filled 2023-11-12: qty 10

## 2023-11-12 MED ORDER — MECLIZINE HCL 25 MG PO TABS: 25.0000 mg | ORAL_TABLET | Freq: Three times a day (TID) | ORAL | 0 refills | Status: AC | PRN

## 2023-11-12 NOTE — ED Triage Notes (Signed)
 Pt reports she thinks she is having an onset of vertigo since Thurs pm; Meclizine  not helping; denies NV, amb to triage w/o difficulty

## 2023-11-12 NOTE — ED Provider Notes (Signed)
 [place] [facility]  Provider Note  CSN: [id number] Arrival date & time: 11/12/23 1216  History Chief Complaint  Patient presents with   Dizziness    [name] is a 68 y.o. female with history of peripheral vertigo reports onset of similar symptoms 2 days ago, not as severe as in the past, but not resolved with meclizine  prescribed last year. She reports decreased hearing particularly on the right. No vomiting, able to walk without difficulty. Somewhat worse with positions, but improves after a short time.    Home Medications Prior to Admission medications   Medication Sig Start Date End Date Taking? Authorizing Provider  meclizine  (ANTIVERT ) 25 MG tablet Take 1 tablet (25 mg total) by mouth 3 (three) times daily as needed for dizziness. 11/12/23  Yes Roselyn Carlin NOVAK, MD  amLODipine  (NORVASC ) 5 MG tablet Take 1 tablet (5 mg total) by mouth daily. 12/23/22 08/04/23  Conte, Tessa N, PA-C  anastrozole  (ARIMIDEX ) 1 MG tablet Take 1 tablet by mouth once daily 09/04/23   Causey, Lindsey Cornetto, NP  carboxymethylcellulose (REFRESH PLUS) 0.5 % SOLN 1-2 drops 3 (three) times daily as needed (dry/irritated eyes.).    [provider]  COLLAGEN PO Take 3 tablets by mouth daily.    [provider]  lisinopril  (ZESTRIL ) 20 MG tablet Take 2 tablets (40 mg total) by mouth at bedtime. 03/23/23   Lucien Orren SAILOR, PA-C  metoprolol  succinate (TOPROL  XL) 50 MG 24 hr tablet Take 1.5 tablets (75 mg total) by mouth daily. Take with or immediately following a meal. 12/23/22   Lucien Orren SAILOR, PA-C  Multiple Vitamin (MULTIVITAMIN WITH MINERALS) TABS tablet Take 1 tablet by mouth in the morning.    [provider]     Allergies    Shellfish allergy and Hydrocodone , benzhydrocodone, and hydromorphone   Review of Systems   Review of Systems Please see HPI for pertinent positives and negatives  Physical Exam BP 135/71 (BP Location: Right  Arm)   Pulse 98   Temp 98.4 F (36.9 C) (Oral)   Resp 16   Ht 5' 6.5 (1.689 m)   Wt 74.8 kg   SpO2 99%   BMI 26.23 kg/m   Physical Exam Vitals and nursing note reviewed.  Constitutional:      Appearance: Normal appearance.  HENT:     Head: Normocephalic and atraumatic.     Right Ear: There is impacted cerumen.     Left Ear: There is impacted cerumen.     Nose: Nose normal.     Mouth/Throat:     Mouth: Mucous membranes are moist.  Eyes:     Extraocular Movements: Extraocular movements intact.     Conjunctiva/sclera: Conjunctivae normal.  Cardiovascular:     Rate and Rhythm: Normal rate.  Pulmonary:     Effort: Pulmonary effort is normal.     Breath sounds: Normal breath sounds.  Abdominal:     General: Abdomen is flat.     Palpations: Abdomen is soft.     Tenderness: There is no abdominal tenderness.  Musculoskeletal:        General: No swelling. Normal range of motion.     Cervical back: Neck supple.  Skin:    General: Skin is warm and dry.  Neurological:     General: No focal deficit present.     Mental Status: She is alert and oriented to person, place, and time.     Cranial Nerves: No cranial  nerve deficit.     Sensory: No sensory deficit.     Motor: No weakness.     Coordination: Coordination normal.     Gait: Gait normal.  Psychiatric:        Mood and Affect: Mood normal.     ED Results / Procedures / Treatments   EKG EKG Interpretation Date/Time:  Saturday November 12 2023 12:26:29 EDT Ventricular Rate:  86 PR Interval:  128 QRS Duration:  81 QT Interval:  332 QTC Calculation: 397 R Axis:   60  Text Interpretation: Sinus rhythm LAE, consider biatrial enlargement Borderline repolarization abnormality No significant change since last tracing Confirmed by Roselyn Dunnings [phone]) on 11/12/2023 12:27:01 PM  Procedures Ear Cerumen Removal  Date/Time: 11/12/2023 12:50 PM  Performed by: Roselyn Dunnings NOVAK, MD Authorized by: Roselyn Dunnings NOVAK, MD    Consent:    Consent obtained:  Verbal   Consent given by:  Patient Procedure details:    Location:  L ear   Procedure type: curette     Procedure outcomes: cerumen removed (partial)   Post-procedure details:    Inspection:  No bleeding   Procedure completion:  Tolerated well, no immediate complications Ear Cerumen Removal  Date/Time: 11/12/2023 12:51 PM  Performed by: Roselyn Dunnings NOVAK, MD Authorized by: Roselyn Dunnings NOVAK, MD   Consent:    Consent obtained:  Verbal   Consent given by:  Patient Procedure details:    Location:  R ear   Procedure type: curette     Procedure outcomes: cerumen removed (partial)   Post-procedure details:    Inspection:  No bleeding   Procedure completion:  Tolerated well, no immediate complications   Medications Ordered in the ED Medications  docusate (COLACE) 50 MG/5ML liquid 10 mg (10 mg Both EARS Given 11/12/23 1308)    Initial Impression and Plan  Patient here symptoms consistent with peripheral vertigo, does not have any focal neuro findings. She has bilateral cerumen impactions which may be contributing. I was able to partially disimpact both, but will also need irrigation. Check basic labs.   ED Course   Clinical Course as of 11/12/23 1355  Sat Nov 12, 2023  1334 CBC is normal.  [CS]  1350 CMP is unremarkable. Per RN, large amount of cerumen removed, canals are clear now. She reports improved hearing and maybe improved dizziness. Will plan discharge with Rx for Meclizine , recommend she follow up with PCP if not improving in 2-3 days.  [CS]    Clinical Course User Index [CS] Roselyn Dunnings NOVAK, MD     MDM Rules/Calculators/A&P Medical Decision Making Problems Addressed: Bilateral impacted cerumen: acute illness or injury Vertigo: acute illness or injury  Amount and/or Complexity of Data Reviewed Labs: ordered. Decision-making details documented in ED Course. ECG/medicine tests: ordered and independent interpretation  performed. Decision-making details documented in ED Course.  Risk OTC drugs. Prescription drug management.     Final Clinical Impression(s) / ED Diagnoses Final diagnoses:  Vertigo  Bilateral impacted cerumen    Rx / DC Orders ED Discharge Orders          Ordered    meclizine  (ANTIVERT ) 25 MG tablet  3 times daily PRN        11/12/23 1354             Roselyn Dunnings NOVAK, MD 11/12/23 1355

## 2023-11-15 ENCOUNTER — Other Ambulatory Visit: Payer: Self-pay | Admitting: Physician Assistant

## 2023-12-03 ENCOUNTER — Other Ambulatory Visit: Payer: Self-pay | Admitting: Adult Health

## 2023-12-03 DIAGNOSIS — D0511 Intraductal carcinoma in situ of right breast: Secondary | ICD-10-CM

## 2023-12-18 ENCOUNTER — Other Ambulatory Visit: Payer: Self-pay | Admitting: Physician Assistant

## 2023-12-19 ENCOUNTER — Other Ambulatory Visit: Payer: Self-pay | Admitting: Physician Assistant

## 2024-01-24 ENCOUNTER — Other Ambulatory Visit: Payer: Self-pay | Admitting: Physician Assistant

## 2024-01-25 ENCOUNTER — Telehealth: Payer: Self-pay | Admitting: Physician Assistant

## 2024-01-25 MED ORDER — AMLODIPINE BESYLATE 5 MG PO TABS: 5.0000 mg | ORAL_TABLET | Freq: Every day | ORAL | 0 refills | Status: DC

## 2024-01-25 MED ORDER — METOPROLOL SUCCINATE ER 50 MG PO TB24: 75.0000 mg | ORAL_TABLET | Freq: Every day | ORAL | 0 refills | Status: DC

## 2024-01-25 NOTE — Telephone Encounter (Signed)
 Pt scheduled for 03/09/24, will send enough until pt's appointment

## 2024-01-25 NOTE — Telephone Encounter (Signed)
°*  STAT* If patient is at the pharmacy, call can be transferred to refill team.   1. Which medications need to be refilled? (please list name of each medication and dose if known)  amLODipine  (NORVASC ) 5 MG tablet   metoprolol  succinate (TOPROL  XL) 50 MG 24 hr tablet    2. Would you like to learn more about the convenience, safety, & potential cost savings by using the [facility]? no   3. Are you open to using the [facility] (Type [facility]. no   4. Which pharmacy/location (including street and city if local pharmacy) is medication to be sent to? [facility] - [place], [place] - [address]     5. Do they need a 30 day or 90 day supply? 90 day     Pt completely out.

## 2024-02-26 ENCOUNTER — Other Ambulatory Visit: Payer: Self-pay | Admitting: Adult Health

## 2024-02-26 DIAGNOSIS — D0511 Intraductal carcinoma in situ of right breast: Secondary | ICD-10-CM

## 2024-03-06 ENCOUNTER — Other Ambulatory Visit: Payer: Self-pay | Admitting: Physician Assistant

## 2024-03-06 ENCOUNTER — Telehealth: Payer: Self-pay | Admitting: Physician Assistant

## 2024-03-06 MED ORDER — METOPROLOL SUCCINATE ER 50 MG PO TB24: 75.0000 mg | ORAL_TABLET | Freq: Every day | ORAL | 0 refills | Status: AC

## 2024-03-06 MED ORDER — AMLODIPINE BESYLATE 5 MG PO TABS: 5.0000 mg | ORAL_TABLET | Freq: Every day | ORAL | 0 refills | Status: AC

## 2024-03-06 NOTE — Telephone Encounter (Signed)
" °*  STAT* If patient is at the pharmacy, call can be transferred to refill team.   1. Which medications need to be refilled? (please list name of each medication and dose if known)   amLODipine  (NORVASC ) 5 MG tablet  Take 1 tablet (5 mg total) by mouth daily.   metoprolol  succinate (TOPROL  XL) 50 MG 24 hr tablet                                         Take 1.5 tablets (75 mg total) by mouth daily. Take with or immediately following a meal.   2. Would you like to learn more about the convenience, safety, & potential cost savings by using the [facility]? No    3. Are you open to using the [facility] No   4. Which pharmacy/location (including street and city if local pharmacy) is medication to be sent to?[facility] [address], [place] - [address]    5. Do they need a 30 day or 90 day supply? 90 Day Supply "

## 2024-03-09 ENCOUNTER — Ambulatory Visit: Admitting: Cardiology

## 2024-04-02 ENCOUNTER — Ambulatory Visit: Admitting: Physician Assistant

## 2024-05-22 ENCOUNTER — Ambulatory Visit: Admitting: Hematology and Oncology
